# Patient Record
Sex: Female | Born: 1935 | Race: White | Hispanic: No | Smoking: Never smoker
Health system: Southern US, Community
[De-identification: ages and names within clinical notes are randomized; demographics above are authoritative.]

## PROBLEM LIST (undated history)

## (undated) DIAGNOSIS — K219 Gastro-esophageal reflux disease without esophagitis: Secondary | ICD-10-CM

## (undated) DIAGNOSIS — I639 Cerebral infarction, unspecified: Secondary | ICD-10-CM

## (undated) DIAGNOSIS — H409 Unspecified glaucoma: Secondary | ICD-10-CM

## (undated) DIAGNOSIS — I071 Rheumatic tricuspid insufficiency: Secondary | ICD-10-CM

## (undated) DIAGNOSIS — F419 Anxiety disorder, unspecified: Secondary | ICD-10-CM

## (undated) DIAGNOSIS — S72002A Fracture of unspecified part of neck of left femur, initial encounter for closed fracture: Secondary | ICD-10-CM

## (undated) DIAGNOSIS — I10 Essential (primary) hypertension: Secondary | ICD-10-CM

## (undated) DIAGNOSIS — E785 Hyperlipidemia, unspecified: Secondary | ICD-10-CM

## (undated) DIAGNOSIS — K635 Polyp of colon: Secondary | ICD-10-CM

## (undated) DIAGNOSIS — C55 Malignant neoplasm of uterus, part unspecified: Secondary | ICD-10-CM

## (undated) DIAGNOSIS — M109 Gout, unspecified: Secondary | ICD-10-CM

## (undated) DIAGNOSIS — J8410 Pulmonary fibrosis, unspecified: Secondary | ICD-10-CM

## (undated) DIAGNOSIS — M810 Age-related osteoporosis without current pathological fracture: Secondary | ICD-10-CM

## (undated) HISTORY — DX: Cerebral infarction, unspecified: I63.9

## (undated) HISTORY — PX: ABDOMINAL HYSTERECTOMY: SHX81

## (undated) HISTORY — PX: TONSILLECTOMY: SUR1361

## (undated) HISTORY — DX: Hyperlipidemia, unspecified: E78.5

## (undated) HISTORY — DX: Gastro-esophageal reflux disease without esophagitis: K21.9

## (undated) HISTORY — DX: Gout, unspecified: M10.9

## (undated) HISTORY — PX: BILATERAL OOPHORECTOMY: SHX1221

## (undated) HISTORY — PX: CATARACT EXTRACTION: SUR2

## (undated) HISTORY — PX: ORIF ACETABULAR FRACTURE: SHX5029

## (undated) HISTORY — DX: Unspecified glaucoma: H40.9

## (undated) HISTORY — DX: Age-related osteoporosis without current pathological fracture: M81.0

## (undated) HISTORY — DX: Polyp of colon: K63.5

## (undated) HISTORY — DX: Malignant neoplasm of uterus, part unspecified: C55

## (undated) HISTORY — DX: Anxiety disorder, unspecified: F41.9

## (undated) HISTORY — PX: FOOT SURGERY: SHX648

## (undated) HISTORY — DX: Essential (primary) hypertension: I10

## (undated) HISTORY — DX: Fracture of unspecified part of neck of left femur, initial encounter for closed fracture: S72.002A

## (undated) HISTORY — DX: Rheumatic tricuspid insufficiency: I07.1

## (undated) HISTORY — DX: Pulmonary fibrosis, unspecified: J84.10

---

## 1974-07-20 DIAGNOSIS — C55 Malignant neoplasm of uterus, part unspecified: Secondary | ICD-10-CM

## 1974-07-20 HISTORY — DX: Malignant neoplasm of uterus, part unspecified: C55

## 1999-09-04 ENCOUNTER — Ambulatory Visit (HOSPITAL_COMMUNITY): Admission: RE | Admit: 1999-09-04 | Discharge: 1999-09-04 | Payer: Self-pay | Admitting: Gastroenterology

## 1999-09-04 ENCOUNTER — Encounter (INDEPENDENT_AMBULATORY_CARE_PROVIDER_SITE_OTHER): Payer: Self-pay | Admitting: Specialist

## 2002-07-19 ENCOUNTER — Ambulatory Visit (HOSPITAL_COMMUNITY): Admission: RE | Admit: 2002-07-19 | Discharge: 2002-07-19 | Payer: Self-pay | Admitting: Family Medicine

## 2002-07-19 ENCOUNTER — Encounter: Payer: Self-pay | Admitting: Cardiovascular Disease

## 2002-11-23 ENCOUNTER — Ambulatory Visit (HOSPITAL_COMMUNITY): Admission: RE | Admit: 2002-11-23 | Discharge: 2002-11-23 | Payer: Self-pay | Admitting: Gastroenterology

## 2002-11-23 ENCOUNTER — Encounter (INDEPENDENT_AMBULATORY_CARE_PROVIDER_SITE_OTHER): Payer: Self-pay | Admitting: Specialist

## 2008-07-20 DIAGNOSIS — S72002A Fracture of unspecified part of neck of left femur, initial encounter for closed fracture: Secondary | ICD-10-CM

## 2008-07-20 HISTORY — DX: Fracture of unspecified part of neck of left femur, initial encounter for closed fracture: S72.002A

## 2009-05-05 ENCOUNTER — Inpatient Hospital Stay (HOSPITAL_COMMUNITY): Admission: EM | Admit: 2009-05-05 | Discharge: 2009-05-08 | Payer: Self-pay | Admitting: Emergency Medicine

## 2009-05-09 ENCOUNTER — Inpatient Hospital Stay (HOSPITAL_COMMUNITY): Admission: AD | Admit: 2009-05-09 | Discharge: 2009-05-14 | Payer: Self-pay | Admitting: Orthopedic Surgery

## 2009-05-10 ENCOUNTER — Encounter (INDEPENDENT_AMBULATORY_CARE_PROVIDER_SITE_OTHER): Payer: Self-pay | Admitting: Orthopedic Surgery

## 2009-05-10 ENCOUNTER — Ambulatory Visit: Payer: Self-pay | Admitting: Vascular Surgery

## 2009-07-23 ENCOUNTER — Encounter: Admission: RE | Admit: 2009-07-23 | Discharge: 2009-10-24 | Payer: Self-pay | Admitting: Orthopedic Surgery

## 2010-07-20 DIAGNOSIS — I639 Cerebral infarction, unspecified: Secondary | ICD-10-CM

## 2010-07-20 HISTORY — DX: Cerebral infarction, unspecified: I63.9

## 2010-10-23 LAB — BASIC METABOLIC PANEL
BUN: 14 mg/dL (ref 6–23)
CO2: 28 mEq/L (ref 19–32)
CO2: 30 mEq/L (ref 19–32)
Calcium: 8.1 mg/dL — ABNORMAL LOW (ref 8.4–10.5)
Calcium: 8.2 mg/dL — ABNORMAL LOW (ref 8.4–10.5)
Calcium: 8.8 mg/dL (ref 8.4–10.5)
Chloride: 102 mEq/L (ref 96–112)
Chloride: 103 mEq/L (ref 96–112)
Creatinine, Ser: 0.63 mg/dL (ref 0.4–1.2)
Creatinine, Ser: 0.64 mg/dL (ref 0.4–1.2)
GFR calc Af Amer: >60 mL/min (ref >60)
GFR calc non Af Amer: >60 mL/min (ref >60)
GFR calc non Af Amer: >60 mL/min (ref >60)
Glucose, Bld: 148 mg/dL — ABNORMAL HIGH (ref 70–99)
Glucose, Bld: 281 mg/dL — ABNORMAL HIGH (ref 70–99)
Potassium: 3.8 mEq/L (ref 3.5–5.1)
Potassium: 4 mEq/L (ref 3.5–5.1)

## 2010-10-23 LAB — CBC
HCT: 25.7 % — ABNORMAL LOW (ref 36.0–46.0)
HCT: 29 % — ABNORMAL LOW (ref 36.0–46.0)
HCT: 30.8 % — ABNORMAL LOW (ref 36.0–46.0)
HCT: 31 % — ABNORMAL LOW (ref 36.0–46.0)
HCT: 33.1 % — ABNORMAL LOW (ref 36.0–46.0)
HCT: 35.1 % — ABNORMAL LOW (ref 36.0–46.0)
Hemoglobin: 10.5 g/dL — ABNORMAL LOW (ref 12.0–15.0)
Hemoglobin: 10.6 g/dL — ABNORMAL LOW (ref 12.0–15.0)
Hemoglobin: 10.7 g/dL — ABNORMAL LOW (ref 12.0–15.0)
Hemoglobin: 11.4 g/dL — ABNORMAL LOW (ref 12.0–15.0)
Hemoglobin: 12 g/dL (ref 12.0–15.0)
Hemoglobin: 9.9 g/dL — ABNORMAL LOW (ref 12.0–15.0)
MCHC: 34.1 g/dL (ref 30.0–36.0)
MCHC: 34.3 g/dL (ref 30.0–36.0)
MCHC: 34.4 g/dL (ref 30.0–36.0)
MCHC: 34.6 g/dL (ref 30.0–36.0)
MCV: 90.3 fL (ref 78.0–100.0)
MCV: 90.6 fL (ref 78.0–100.0)
MCV: 91 fL (ref 78.0–100.0)
MCV: 91 fL (ref 78.0–100.0)
MCV: 91.8 fL (ref 78.0–100.0)
Platelets: 136 10*3/uL — ABNORMAL LOW (ref 150–400)
Platelets: 176 10*3/uL (ref 150–400)
Platelets: 249 10*3/uL (ref 150–400)
RBC: 3.35 MIL/uL — ABNORMAL LOW (ref 3.87–5.11)
RBC: 3.38 MIL/uL — ABNORMAL LOW (ref 3.87–5.11)
RBC: 3.45 MIL/uL — ABNORMAL LOW (ref 3.87–5.11)
RBC: 3.46 MIL/uL — ABNORMAL LOW (ref 3.87–5.11)
RBC: 3.67 MIL/uL — ABNORMAL LOW (ref 3.87–5.11)
RBC: 3.86 MIL/uL — ABNORMAL LOW (ref 3.87–5.11)
RBC: 4.22 MIL/uL (ref 3.87–5.11)
RDW: 13.3 % (ref 11.5–15.5)
RDW: 13.9 % (ref 11.5–15.5)
RDW: 14 % (ref 11.5–15.5)
RDW: 14.3 % (ref 11.5–15.5)
WBC: 13.7 10*3/uL — ABNORMAL HIGH (ref 4.0–10.5)
WBC: 8.1 10*3/uL (ref 4.0–10.5)
WBC: 8.4 10*3/uL (ref 4.0–10.5)
WBC: 8.4 10*3/uL (ref 4.0–10.5)
WBC: 8.8 10*3/uL (ref 4.0–10.5)

## 2010-10-23 LAB — DIFFERENTIAL
Basophils Absolute: 0 10*3/uL (ref 0.0–0.1)
Basophils Relative: 0 % (ref 0–1)
Eosinophils Relative: 0 % (ref 0–5)
Eosinophils Relative: 2 % (ref 0–5)
Lymphocytes Relative: 8 % — ABNORMAL LOW (ref 12–46)
Lymphs Abs: 1.2 10*3/uL (ref 0.7–4.0)
Monocytes Absolute: 0.7 10*3/uL (ref 0.1–1.0)
Monocytes Absolute: 0.8 10*3/uL (ref 0.1–1.0)
Monocytes Relative: 5 % (ref 3–12)
Neutrophils Relative %: 75 % (ref 43–77)
Neutrophils Relative %: 86 % — ABNORMAL HIGH (ref 43–77)

## 2010-10-23 LAB — URINALYSIS, ROUTINE W REFLEX MICROSCOPIC
Bilirubin Urine: NEGATIVE
Hgb urine dipstick: NEGATIVE
Nitrite: NEGATIVE
Specific Gravity, Urine: 1.018 (ref 1.005–1.030)
pH: 7.5 (ref 5.0–8.0)

## 2010-10-23 LAB — PROTIME-INR
INR: 1.28 (ref 0.00–1.49)
INR: 1.46 (ref 0.00–1.49)
INR: 1.76 — ABNORMAL HIGH (ref 0.00–1.49)
INR: 1.83 — ABNORMAL HIGH (ref 0.00–1.49)
Prothrombin Time: 13.5 seconds (ref 11.6–15.2)
Prothrombin Time: 18.7 seconds — ABNORMAL HIGH (ref 11.6–15.2)
Prothrombin Time: 21 seconds — ABNORMAL HIGH (ref 11.6–15.2)

## 2010-10-23 LAB — POCT I-STAT, CHEM 8
Calcium, Ion: 1.16 mmol/L (ref 1.12–1.32)
Creatinine, Ser: 0.8 mg/dL (ref 0.4–1.2)
HCT: 39 % (ref 36.0–46.0)
Sodium: 139 mEq/L (ref 135–145)

## 2010-10-23 LAB — TYPE AND SCREEN

## 2010-10-23 LAB — HEMOGLOBIN AND HEMATOCRIT, BLOOD: HCT: 33.3 % — ABNORMAL LOW (ref 36.0–46.0)

## 2010-12-05 NOTE — Op Note (Signed)
NAME:  Mary Church, Mary Church                       ACCOUNT NO.:  [id number]   MEDICAL RECORD NO.:  [id number]                   PATIENT TYPE:  AMB   LOCATION:  ENDO                                 FACILITY:  [facility]   PHYSICIAN:  Petra Kuba, M.D.                 DATE OF BIRTH:  Dec 30, 1922   DATE OF PROCEDURE:  11/23/2002  DATE OF DISCHARGE:                                 OPERATIVE REPORT   PROCEDURE:  Colonoscopy with polypectomy.   INDICATIONS FOR PROCEDURE:  A patient with a history of colon polyps due for  repeat screening.   Consent was signed after risks, benefits, methods, and options were  thoroughly discussed in the past.   MEDICINES USED:  Demerol 60, Versed 7.   DESCRIPTION OF PROCEDURE:  Rectal inspection was pertinent for external  hemorrhoids, small. Digital exam was negative. The pediatric video  adjustable colonoscope was inserted and with some difficulty due to a  tortuous looping sigmoid which required abdominal pressure and rolling her  on her back to advance. Then we were easily able to advance to the cecum. On  insertion, no abnormalities were seen. The cecum was identified by the  appendiceal orifice and the ileocecal valve. The prep on the left side was  greater on the right. We did do a moderate amount of washing and suctioning  for adequate visualization on the right. In the cecal pole, a tiny polyp was  seen and was cold biopsied x2. The scope was further withdrawn. In the mid  ascending, a tiny polyp was seen and was hot biopsied x1 and put in the same  container. No other polypoid lesions were seen as we slowly withdrew around  the transverse to the mid descending where a small polyp was seen and was  hot biopsied x1 and put in the second container. The scope was slowly  withdrawn through tortuous sigmoid through the rectum. Lesions could have  been missed in the sigmoid. We did try to readvance when we fell back around  the curves but that was  difficult due to tortuosity and looping. Once back  in the rectum, the scope was retroflexed pertinent for some internal  hemorrhoids. The scope was straightened and advanced a very short ways up  the left side of the colon, air was suctioned, scope removed. The patient  tolerated the procedure well. There was no obvious or immediate  complications.   ENDOSCOPIC DIAGNOSIS:  1. Internal and external hemorrhoids.  2. Tortuous sigmoid.  3. Three polyps small descending hot biopsied, tiny ascending hot biopsied,     tiny cecal cold biopsied.  4. Otherwise within normal limits to the cecum.    PLAN:  Await pathology but consider her age and other medical conditions  whether she be a repeat screening candidate. Happy to see back p.r.n.,  otherwise, return care to Dr. Dimple Casey for the customary health care maintenance  to  include yearly rectals and guaiacs.                                               Petra Kuba, M.D.    MEM/MEDQ  D:  11/23/2002  T:  11/24/2002  Job:  [id number]   cc:   Magnus Sinning. Dimple Casey, M.D.  [address] [place]  [place] [address]  Fax: [phone]

## 2011-02-17 ENCOUNTER — Emergency Department (HOSPITAL_COMMUNITY): Payer: Medicare Other

## 2011-02-17 ENCOUNTER — Encounter: Payer: Self-pay | Admitting: Ophthalmology

## 2011-02-17 ENCOUNTER — Inpatient Hospital Stay (HOSPITAL_COMMUNITY)
Admission: EM | Admit: 2011-02-17 | Discharge: 2011-02-23 | DRG: 065 | Disposition: A | Discharge status: Skilled Nursing Facility | Payer: Medicare Other | Attending: Internal Medicine | Admitting: Internal Medicine

## 2011-02-17 DIAGNOSIS — E785 Hyperlipidemia, unspecified: Secondary | ICD-10-CM | POA: Diagnosis present

## 2011-02-17 DIAGNOSIS — I6350 Cerebral infarction due to unspecified occlusion or stenosis of unspecified cerebral artery: Principal | ICD-10-CM | POA: Diagnosis present

## 2011-02-17 DIAGNOSIS — I10 Essential (primary) hypertension: Secondary | ICD-10-CM | POA: Diagnosis present

## 2011-02-17 DIAGNOSIS — E119 Type 2 diabetes mellitus without complications: Secondary | ICD-10-CM | POA: Diagnosis present

## 2011-02-17 DIAGNOSIS — R0902 Hypoxemia: Secondary | ICD-10-CM | POA: Diagnosis present

## 2011-02-17 DIAGNOSIS — Z7982 Long term (current) use of aspirin: Secondary | ICD-10-CM

## 2011-02-17 DIAGNOSIS — M109 Gout, unspecified: Secondary | ICD-10-CM | POA: Diagnosis present

## 2011-02-17 DIAGNOSIS — J9819 Other pulmonary collapse: Secondary | ICD-10-CM | POA: Diagnosis not present

## 2011-02-17 LAB — DIFFERENTIAL
Eosinophils Absolute: 0.4 10*3/uL (ref 0.0–0.7)
Eosinophils Relative: 5 % (ref 0–5)
Lymphocytes Relative: 16 % (ref 12–46)
Lymphs Abs: 1.3 10*3/uL (ref 0.7–4.0)
Monocytes Relative: 9 % (ref 3–12)

## 2011-02-17 LAB — CBC
HCT: 36 % (ref 36.0–46.0)
MCH: 30.1 pg (ref 26.0–34.0)
MCV: 88 fL (ref 78.0–100.0)
Platelets: 213 10*3/uL (ref 150–400)
RDW: 12.8 % (ref 11.5–15.5)
WBC: 7.9 10*3/uL (ref 4.0–10.5)

## 2011-02-17 LAB — URINALYSIS, ROUTINE W REFLEX MICROSCOPIC
Bilirubin Urine: NEGATIVE
Ketones, ur: NEGATIVE mg/dL
Specific Gravity, Urine: 1.014 (ref 1.005–1.030)
pH: 6 (ref 5.0–8.0)

## 2011-02-17 LAB — BASIC METABOLIC PANEL
Calcium: 9.2 mg/dL (ref 8.4–10.5)
GFR calc Af Amer: >60 mL/min (ref >60)
GFR calc non Af Amer: >60 mL/min (ref >60)
Potassium: 3.6 mEq/L (ref 3.5–5.1)
Sodium: 142 mEq/L (ref 135–145)

## 2011-02-17 LAB — URINE MICROSCOPIC-ADD ON

## 2011-02-17 LAB — TROPONIN I: Troponin I: <0.3 ng/mL (ref <0.30)

## 2011-02-17 NOTE — H&P (Signed)
Hospital Admission Note Date: 02/18/2011  Patient name: Mary Church Medical record number: [id number] Date of birth: 19-Apr-1923 Age: 77 y.o. Gender: female PCP: Bennie Pierini, FNP, FNP  Medical Service: Internal Medicine Teaching Service  Attending physician:  Dr. Coralee Pesa Resident [phone]): Dr. Scot Dock   Pager: [phone] Resident (R1): Dr. Manson Passey    Pager: [phone]  Chief Complaint: Weakness, dizziness, difficulty with movements  History of Present Illness: Patient is a 77 year old woman with a history of HTN and HLD who presents with an episode of weakness and dizziness that began at about 5pm this evening. She said she sat down to dinner with her friend and she acutely began to feel dizzy as well as unable to 'move her arms like she wanted to'. She also was nervous to stand up as she felt that she would not be able to hold herself steady. She denies vision changes, LOC, difficulty chewing her food, vertigo, nausea, vomiting and diarrhea. She also reports recent weight loss and frequent urination. She has no history of prior stroke.  Meds: Crestor Unknown antihypertensive medications  Allergies: Review of patient's allergies indicates not on file. Past Medical History  Diagnosis Date  . HTN (hypertension)   . HLD (hyperlipidemia)   . History of ovarian cancer 1976  . Pulmonary fibrosis     seen on X-ray   . Hip fracture, left 2010   Past Surgical History  Procedure Date  . Bilateral oophorectomy   . Orif acetabular fracture    Family History  Problem Relation Age of Onset  . Lung cancer Brother   . Diabetes Maternal Aunt    History   Social History  . Marital Status: Widowed    Spouse Name: N/A    Number of Children: N/A  . Years of Education: N/A   Occupational History  . Not on file.   Social History Main Topics  . Smoking status: Never Smoker   . Smokeless tobacco: Not on file  . Alcohol Use: Not on file  . Drug Use: Not on file  . Sexually Active:  Not on file   Social History Narrative   Patient reports she currently lives with a friend who has early stage Alzheimer's disease. She has 2 sisters and a brother who live in the [place] area.   Review of Systems: ROS is otherwise negative except as noted in HPI.  Physical Exam: Vitals: T: 98.1  HR: 72  BP: 199/65  RR: 18  O2 saturation: 92% RA General: elderly woman resting in bed, in no acute distress HEENT: PERRL, EOMI, no scleral icterus, cheeks appear somewhat erythematous Cardiac: RRR, no rubs, murmurs or gallops Pulm: clear to auscultation bilaterally, moving normal volumes of air Abd: soft, nontender, nondistended, BS present Ext: warm and well perfused, no pedal edema Neuro: alert and oriented X3, cranial nerves II-XII grossly intact, strength and sensation to light touch equal in bilateral face and upper and lower extremities.  Lab results: Admission on 02/17/2011  Component Date Value Range Status  . Neutrophils Relative (%) 02/17/2011 70  43-77 Final  . Neutro Abs (K/uL) 02/17/2011 5.6  1.7-7.7 Final  . Lymphocytes Relative (%) 02/17/2011 16  12-46 Final  . Lymphs Abs (K/uL) 02/17/2011 1.3  0.7-4.0 Final  . Monocytes Relative (%) 02/17/2011 9  3-12 Final  . Monocytes Absolute (K/uL) 02/17/2011 0.7  0.1-1.0 Final  . Eosinophils Relative (%) 02/17/2011 5  0-5 Final  . Eosinophils Absolute (K/uL) 02/17/2011 0.4  0.0-0.7 Final  . Basophils  Relative (%) 02/17/2011 0  0-1 Final  . Basophils Absolute (K/uL) 02/17/2011 0.0  0.0-0.1 Final  . WBC (K/uL) 02/17/2011 7.9  4.0-10.5 Final  . RBC (MIL/uL) 02/17/2011 4.09  3.87-5.11 Final  . Hemoglobin (g/dL) 07/22/7251 66.4  40.3-47.4 Final  . HCT (%) 02/17/2011 36.0  36.0-46.0 Final  . MCV (fL) 02/17/2011 88.0  78.0-100.0 Final  . MCH (pg) 02/17/2011 30.1  26.0-34.0 Final  . MCHC (g/dL) 25/95/6387 56.4  33.2-95.1 Final  . RDW (%) 02/17/2011 12.8  11.5-15.5 Final  . Platelets (K/uL) 02/17/2011 213  150-400 Final  . Color,  Urine  02/17/2011 YELLOW  YELLOW Final  . Appearance  02/17/2011 CLEAR  CLEAR Final  . Specific Gravity, Urine  02/17/2011 1.014  1.005-1.030 Final  . pH  02/17/2011 6.0  5.0-8.0 Final  . Glucose, UA (mg/dL) 88/41/6606 NEGATIVE  NEGATIVE Final  . Hgb urine dipstick  02/17/2011 TRACE* NEGATIVE Final  . Bilirubin Urine  02/17/2011 NEGATIVE  NEGATIVE Final  . Ketones, ur (mg/dL) 30/16/0109 NEGATIVE  NEGATIVE Final  . Protein, ur (mg/dL) 32/35/5732 NEGATIVE  NEGATIVE Final  . Urobilinogen, UA (mg/dL) 20/25/4270 0.2  6.2-3.7 Final  . Nitrite  02/17/2011 NEGATIVE  NEGATIVE Final  . Leukocytes, UA  02/17/2011 TRACE* NEGATIVE Final  . Squamous Epithelial / LPF  02/17/2011 RARE  RARE Final  . WBC, UA (WBC/hpf) 02/17/2011 0-2  <3 Final  . RBC / HPF (RBC/hpf) 02/17/2011 0-2  <3 Final  . Bacteria, UA  02/17/2011 RARE  RARE Final  . Daryll Drown  02/17/2011 MUCOUS PRESENT   Final  . Sodium (mEq/L) 02/17/2011 142  135-145 Final  . Potassium (mEq/L) 02/17/2011 3.6  3.5-5.1 Final  . Chloride (mEq/L) 02/17/2011 102  96-112 Final  . CO2 (mEq/L) 02/17/2011 29  19-32 Final  . Glucose, Bld (mg/dL) 62/83/1517 616* 07-37 Final  . BUN (mg/dL) 10/62/6948 21  5-46 Final  . Creatinine, Ser (mg/dL) 27/09/5007 3.81* 8.29-9.37 Final  . Calcium (mg/dL) 16/96/7893 9.2  8.1-01.7 Final  . GFR calc non Af Amer (mL/min) 02/17/2011 >60  >60 Final  . GFR calc Af Amer (mL/min) 02/17/2011 >60  >60 Final  . Troponin I (ng/mL) 02/17/2011 <0.30  <0.30 Final   Imaging results:  CT HEAD WITHOUT CONTRAST IMPRESSION:  Chronic diffuse atrophic and small vessel ischemic changes. Suggestion of decreased density in the left insular cortex which might represent early changes of acute infarct. No evidence of acute intracranial hemorrhage or mass lesion.  CHEST - 2 VIEW IMPRESSION:  Mild pulmonary fibrosis which appears to be developing since the prior study. Chronic bronchitic changes. No active consolidation  Other results: EKG:  sinus rhythm with 1st degree AV block, not significantly changed from prior tracings  Assessment & Plan by Problem: 1) Dizziness and weakness: Acute onset. No history of stroke. No significant lab abnormalities. CT head consistent with early left insular stroke- although not sure about presentation findings due to stroke. Risk factors of hypertension and hyperlipidemia.  - Admit to telemetry bed. - Aspirin 325 mg by mouth daily. - 2-D echo and carotid Dopplers. - HbA1c and fasting lipid panel for risk stratification. - PT OT and speech therapy consult. - Blood pressure management as below.  2) Hypertension: Systolic blood pressure severely elevated in ED. - Will hold blood pressure medication for 24 hours - Will give labetalol if systolic blood pressure goes more than 200.  3) Hyperlipidemia: FLP . - Continue Crestor.  4) VTE prophylaxis: Lovenox.   R2/3______________________________  R1________________________________  ATTENDING: I performed and/or observed a history and physical examination of the patient.  I discussed the case with the residents as noted and reviewed the residents' notes.  I agree with the findings and plan--please refer to the attending physician note for more details.  Signature________________________________  Printed Name_____________________________

## 2011-02-18 ENCOUNTER — Encounter: Payer: Self-pay | Admitting: Ophthalmology

## 2011-02-18 ENCOUNTER — Inpatient Hospital Stay (HOSPITAL_COMMUNITY): Payer: Medicare Other

## 2011-02-18 DIAGNOSIS — I359 Nonrheumatic aortic valve disorder, unspecified: Secondary | ICD-10-CM

## 2011-02-18 DIAGNOSIS — I6350 Cerebral infarction due to unspecified occlusion or stenosis of unspecified cerebral artery: Secondary | ICD-10-CM

## 2011-02-18 LAB — COMPREHENSIVE METABOLIC PANEL
BUN: 14 mg/dL (ref 6–23)
CO2: 26 mEq/L (ref 19–32)
Calcium: 9 mg/dL (ref 8.4–10.5)
Creatinine, Ser: 0.48 mg/dL — ABNORMAL LOW (ref 0.50–1.10)
GFR calc Af Amer: >60 mL/min (ref >60)
GFR calc non Af Amer: >60 mL/min (ref >60)
Glucose, Bld: 279 mg/dL — ABNORMAL HIGH (ref 70–99)

## 2011-02-18 LAB — CBC
HCT: 35 % — ABNORMAL LOW (ref 36.0–46.0)
Hemoglobin: 12 g/dL (ref 12.0–15.0)
MCH: 30 pg (ref 26.0–34.0)
MCHC: 34.3 g/dL (ref 30.0–36.0)
RDW: 12.7 % (ref 11.5–15.5)

## 2011-02-18 LAB — HEPATIC FUNCTION PANEL
AST: 17 U/L (ref 0–37)
Bilirubin, Direct: 0.1 mg/dL (ref 0.0–0.3)
Indirect Bilirubin: 0.2 mg/dL — ABNORMAL LOW (ref 0.3–0.9)
Total Bilirubin: 0.3 mg/dL (ref 0.3–1.2)

## 2011-02-18 LAB — CK TOTAL AND CKMB (NOT AT ARMC): CK, MB: 2.8 ng/mL (ref 0.3–4.0)

## 2011-02-18 LAB — LIPID PANEL
Cholesterol: 135 mg/dL (ref 0–200)
Total CHOL/HDL Ratio: 3.4 RATIO
Triglycerides: 200 mg/dL — ABNORMAL HIGH (ref <150)
VLDL: 40 mg/dL (ref 0–40)

## 2011-02-18 LAB — PROTIME-INR
INR: 1.05 (ref 0.00–1.49)
INR: 1.06 (ref 0.00–1.49)
Prothrombin Time: 13.9 seconds (ref 11.6–15.2)

## 2011-02-18 LAB — CARDIAC PANEL(CRET KIN+CKTOT+MB+TROPI): Total CK: 137 U/L (ref 7–177)

## 2011-02-18 LAB — GLUCOSE, CAPILLARY: Glucose-Capillary: 220 mg/dL — ABNORMAL HIGH (ref 70–99)

## 2011-02-19 DIAGNOSIS — I6350 Cerebral infarction due to unspecified occlusion or stenosis of unspecified cerebral artery: Secondary | ICD-10-CM

## 2011-02-19 DIAGNOSIS — I6330 Cerebral infarction due to thrombosis of unspecified cerebral artery: Secondary | ICD-10-CM

## 2011-02-19 LAB — GLUCOSE, CAPILLARY: Glucose-Capillary: 217 mg/dL — ABNORMAL HIGH (ref 70–99)

## 2011-02-19 LAB — BASIC METABOLIC PANEL
CO2: 30 mEq/L (ref 19–32)
Calcium: 9.2 mg/dL (ref 8.4–10.5)
Chloride: 101 mEq/L (ref 96–112)
Sodium: 141 mEq/L (ref 135–145)

## 2011-02-19 LAB — CBC
MCH: 30 pg (ref 26.0–34.0)
Platelets: 211 10*3/uL (ref 150–400)
RBC: 4.04 MIL/uL (ref 3.87–5.11)
WBC: 9.7 10*3/uL (ref 4.0–10.5)

## 2011-02-19 LAB — MAGNESIUM: Magnesium: 1.8 mg/dL (ref 1.5–2.5)

## 2011-02-20 LAB — GLUCOSE, CAPILLARY
Glucose-Capillary: 161 mg/dL — ABNORMAL HIGH (ref 70–99)
Glucose-Capillary: 190 mg/dL — ABNORMAL HIGH (ref 70–99)

## 2011-02-20 LAB — BASIC METABOLIC PANEL
CO2: 30 mEq/L (ref 19–32)
Calcium: 9.6 mg/dL (ref 8.4–10.5)
Chloride: 101 mEq/L (ref 96–112)
Glucose, Bld: 187 mg/dL — ABNORMAL HIGH (ref 70–99)
Potassium: 3.8 mEq/L (ref 3.5–5.1)
Sodium: 140 mEq/L (ref 135–145)

## 2011-02-20 LAB — CBC
Hemoglobin: 12.5 g/dL (ref 12.0–15.0)
MCH: 29.9 pg (ref 26.0–34.0)
RBC: 4.18 MIL/uL (ref 3.87–5.11)

## 2011-02-21 LAB — BASIC METABOLIC PANEL
BUN: 19 mg/dL (ref 6–23)
Chloride: 99 mEq/L (ref 96–112)
Potassium: 3.7 mEq/L (ref 3.5–5.1)
Sodium: 138 mEq/L (ref 135–145)

## 2011-02-21 LAB — GLUCOSE, CAPILLARY
Glucose-Capillary: 166 mg/dL — ABNORMAL HIGH (ref 70–99)
Glucose-Capillary: 184 mg/dL — ABNORMAL HIGH (ref 70–99)
Glucose-Capillary: 185 mg/dL — ABNORMAL HIGH (ref 70–99)

## 2011-02-21 LAB — CBC
HCT: 36.9 % (ref 36.0–46.0)
Hemoglobin: 12.7 g/dL (ref 12.0–15.0)
MCHC: 34.4 g/dL (ref 30.0–36.0)
RDW: 12.9 % (ref 11.5–15.5)
WBC: 8.5 10*3/uL (ref 4.0–10.5)

## 2011-02-22 LAB — BASIC METABOLIC PANEL
Calcium: 9.4 mg/dL (ref 8.4–10.5)
GFR calc Af Amer: >60 mL/min (ref >60)
GFR calc non Af Amer: >60 mL/min (ref >60)
Potassium: 4 mEq/L (ref 3.5–5.1)
Sodium: 139 mEq/L (ref 135–145)

## 2011-02-22 LAB — GLUCOSE, CAPILLARY
Glucose-Capillary: 168 mg/dL — ABNORMAL HIGH (ref 70–99)
Glucose-Capillary: 142 mg/dL — ABNORMAL HIGH (ref 70–99)

## 2011-02-22 LAB — CBC
MCH: 30.4 pg (ref 26.0–34.0)
MCHC: 34.5 g/dL (ref 30.0–36.0)
Platelets: 232 10*3/uL (ref 150–400)
RDW: 13 % (ref 11.5–15.5)

## 2011-02-22 LAB — MAGNESIUM: Magnesium: 1.8 mg/dL (ref 1.5–2.5)

## 2011-02-23 DIAGNOSIS — I6350 Cerebral infarction due to unspecified occlusion or stenosis of unspecified cerebral artery: Secondary | ICD-10-CM

## 2011-02-23 LAB — CBC
HCT: 36.4 % (ref 36.0–46.0)
MCH: 29.4 pg (ref 26.0–34.0)
MCHC: 33.5 g/dL (ref 30.0–36.0)
MCV: 87.7 fL (ref 78.0–100.0)
Platelets: 275 10*3/uL (ref 150–400)
RDW: 12.9 % (ref 11.5–15.5)
WBC: 7.7 10*3/uL (ref 4.0–10.5)

## 2011-02-23 LAB — BASIC METABOLIC PANEL
BUN: 21 mg/dL (ref 6–23)
Calcium: 9.5 mg/dL (ref 8.4–10.5)
Chloride: 101 mEq/L (ref 96–112)
Creatinine, Ser: 0.47 mg/dL — ABNORMAL LOW (ref 0.50–1.10)
GFR calc Af Amer: >60 mL/min (ref >60)

## 2011-03-01 NOTE — Discharge Summary (Signed)
Mary Church, Mary Church             ACCOUNT NO.:  [id number]  MEDICAL RECORD NO.:  [id number]  LOCATION:  3703                         FACILITY:  [facility]  PHYSICIAN:  Tilford Pillar, MD     DATE OF BIRTH:  07/27/1922  DATE OF ADMISSION:  02/17/2011 DATE OF DISCHARGE:  02/23/2011                        DISCHARGE SUMMARY - REFERRING   DISCHARGE DIAGNOSES: 1. Acute stroke. 2. Hypertension. 3. Diabetes. 4. Hyperlipidemia.  DISCHARGE MEDICATION LIST: 1. Hydrochlorothiazide 25 mg by mouth daily. 2. Lisinopril 40 mg by mouth daily. 3. Metformin 500 mg by mouth twice daily with meals. 4. Allopurinol 100 mg 1 tablet by mouth daily. 5. Aspirin 81 mg by mouth daily. 6. Crestor 10 mg 1 tablet by mouth daily. 7. Felodipine ER 10 mg by mouth daily. 8. Vitamin E oral 1 tablet by mouth daily.  Stop taking lisinopril/hydrochlorothiazide 20/25 mg.  DISPOSITION AND FOLLOWUP:  The patient was discharge from [facility] in stable condition with resolution of neurologic symptoms. The patient is to follow up with Mary-Estella Daphine Deutscher at  [facility], phone number [phone], for continued management of her diabetes and hypertension.  PROCEDURES PERFORMED:  Chest x-ray on February 17, 2011, mild pulmonary fibrosis, chronic bronchitic changes, no active consolidation.  CT of the head on February 17, 2011, chronic diffuse atrophic and small vessel ischemic changes suggestion of decreased intensity in the left insular cortex which might represent early changes of acute infarct.  No evidence of acute intracranial hemorrhage or mass or lesion.  MRI of the head on February 18, 2011, acute/subacute nonhemorrhagic infarct involving the posterior and left corpus callosum.  Atrophy and moderate white matter.  EKG on February 18, 2011, shows sinus rhythm with first-degree atrioventricular block not significantly change from prior tracings.  CONSULTATIONS:  None.  ADMITTING HISTORY AND  PHYSICAL:  The patient is an 77 year old woman with a history of hypertension and hyperlipidemia, who presents for an episode of weakness and dizziness that began at about 5 p.m. this evening.  She sat down to dinner with a friend and she acutely began to feel dizzy as well as unable to "move her arms like she wanted to."  She also felt that she was not able to hold for herself steady.  She denies vision changes, loss of consciousness, difficulty chewing her food, vertigo, nausea, vomiting, and diarrhea.  She also reports recent weight loss and frequent urination.  She has no history of prior stroke.  PHYSICAL EXAMINATION:  VITAL SIGNS:  Temperature 98.1, blood pressure 199/65, heart rate 72, respiratory rate 18, and oxygen saturation 92% on room air. GENERAL:  An elderly woman resting in bed, in no acute distress. HEENT:  Pupils equal, round, and reactive to light.  Extraocular movements intact.  No scleral icterus.  Cheeks appears somewhat erythematous. CARDIAC:  Regular rate and rhythm.  No rubs, murmurs, or gallops. PULMONARY:  Clear to auscultation bilaterally, moving normal volumes of air. ABDOMEN:  Soft, nontender, nondistended.  Bowel sounds present. EXTREMITIES:  Warm and well perfused, no pedal edema. NEUROLOGIC:  Alert and oriented x3.  Cranial nerves II-XII are grossly intact.  Strength and sensation to light equal in bilateral face and upper and lower extremities.  ADMITTING LABS:  Sodium 132, potassium 3.2, chloride 102, bicarb 29, BUN 21, creatinine 0.48, glucose 179.  WBC 7.9, hemoglobin 12.3, hematocrit 36, platelets 213.  Urinalysis is negative for nitrites, trace leukocytes, rare squamous epithelial, 0-2 white blood cell count, trace hemoglobin, 0-2 rbcs.  Troponin less than 0.3.  HOSPITAL COURSE BY PROBLEM: 1. Acute stroke.  An acute/subacute infarct involving the posterior     left corpus callosum which seen on CT and MRI on admission with     symptoms of  weakness and dizziness.  The patient was given aspirin 325 mg and admitted to telemetry.  Carotid Dopplers were normal.  Echocardiogram showed ejection fraction of 60-65% with mitral valve thickening.  Hemoglobin A1c was found to 8.5.  The patient's improved the next day and gradually improved back to baseline by hospital discharge.  Her stroke was thought to be contributed by her risk factors of diabetes, hypertension, and hyperlipidemia.  The patient was discharged in stable condition on aspirin. 2. Diabetes.  The patient was admitted with hemoglobin A1c of 8.5.     The patient notes 1 year history of increased capillary blood     glucose levels, but not official diagnosis of diabetes.  The     patient was started on metformin in hospital and discharged at a     dose of 500 mg p.o. b.i.d.  The patient's capillary blood glucose     levels in hospital were in the 100s-200s.  The patient will follow     up for her diabetes on an outpatient basis. 3. Hypertension.  The patient was admitted with blood pressure of     199/65.  She was allowed to have permissive hypertension for the     first 24 hours in the setting of acute stroke.  Her home blood     pressure medications were gradually added back including lisinopril     20, hydrochlorothiazide 25, and felodipine 10.  Her lisinopril was     increased to 40 mg.  Followup as an outpatient. 4. Hyperlipidemia.  The patient was on home medications of Crestor 10,     her LDL in hospital was found to be 55, she was discharged on her     home Crestor dose.  DISCHARGE LABS AND VITALS:  Temperature 97.7, blood pressure 160/68, pulse 60, respirations 18, oxygen saturation 96% on room air.  WBC 7.7, hemoglobin 12.2, hematocrit 36.4, platelets 275.  Capillary blood glucose 114.  Sodium 139, potassium 3.8, chloride 101, bicarb 28, BUN 21, creatinine 0.47, glucose 167, and calcium 9.5.    ______________________________ Janalyn Harder,  MD   ______________________________ Tilford Pillar, MD    RB/MEDQ  D:  02/23/2011  T:  02/23/2011  Job:  [id number]  cc:   Mary-Congetta Daphine Deutscher  Electronically Signed by Janalyn Harder MD on 03/01/2011 08:57:56 AM Electronically Signed by Tilford Pillar  on 03/01/2011 01:01:59 PM

## 2012-01-31 ENCOUNTER — Encounter (HOSPITAL_COMMUNITY): Payer: Self-pay

## 2012-01-31 ENCOUNTER — Emergency Department (HOSPITAL_COMMUNITY)
Admission: EM | Admit: 2012-01-31 | Discharge: 2012-01-31 | Disposition: A | Discharge status: Home / Self Care | Payer: BC Managed Care – PPO | Attending: Emergency Medicine | Admitting: Emergency Medicine

## 2012-01-31 DIAGNOSIS — W57XXXA Bitten or stung by nonvenomous insect and other nonvenomous arthropods, initial encounter: Secondary | ICD-10-CM

## 2012-01-31 DIAGNOSIS — E785 Hyperlipidemia, unspecified: Secondary | ICD-10-CM | POA: Insufficient documentation

## 2012-01-31 DIAGNOSIS — E119 Type 2 diabetes mellitus without complications: Secondary | ICD-10-CM | POA: Insufficient documentation

## 2012-01-31 DIAGNOSIS — S90569A Insect bite (nonvenomous), unspecified ankle, initial encounter: Secondary | ICD-10-CM | POA: Insufficient documentation

## 2012-01-31 DIAGNOSIS — I10 Essential (primary) hypertension: Secondary | ICD-10-CM | POA: Insufficient documentation

## 2012-01-31 MED ORDER — DOXYCYCLINE HYCLATE 100 MG PO TABS
100.0000 mg | ORAL_TABLET | Freq: Once | ORAL | Status: AC
  Administered 2012-01-31: 100 mg via ORAL
  Filled 2012-01-31: qty 1

## 2012-01-31 MED ORDER — DOXYCYCLINE HYCLATE 100 MG PO CAPS: 100.0000 mg | ORAL_CAPSULE | Freq: Two times a day (BID) | ORAL | Status: AC

## 2012-01-31 NOTE — ED Notes (Signed)
Arrived by ems for tick bite to left lower leg--behind knee

## 2012-01-31 NOTE — ED Provider Notes (Signed)
Medical screening examination/treatment/procedure(s) were performed by non-physician practitioner and as supervising physician I was immediately available for consultation/collaboration.   Delton Stelle L Teruko Joswick, MD 01/31/12 1511 

## 2012-01-31 NOTE — ED Notes (Signed)
Pt reports can't get a ride until 2pm.  Told pt she could stay in room until her ride came or until we needed the room.  Ordered pt a meal tray.

## 2012-01-31 NOTE — ED Notes (Signed)
Pt presents with small tick to back of left leg. Pt denies rash, fever and unexplained weakness.

## 2012-01-31 NOTE — ED Notes (Signed)
Meal tray given 

## 2012-01-31 NOTE — ED Provider Notes (Signed)
History     CSN: [id number]  Arrival date & time 01/31/12  1040   First MD Initiated Contact with Patient 01/31/12 1051      Chief Complaint  Patient presents with  . Tick Removal    (Consider location/radiation/quality/duration/timing/severity/associated sxs/prior treatment) HPI Comments: Pt noted a tiny tick imbedded in L leg last PM.  States it has itched all night long.  The history is provided by the patient. No language interpreter was used.    Past Medical History  Diagnosis Date  . HTN (hypertension)   . HLD (hyperlipidemia)   . History of ovarian cancer 1976  . Pulmonary fibrosis     seen on X-ray   . Hip fracture, left 2010  . Diabetes mellitus     Past Surgical History  Procedure Date  . Bilateral oophorectomy   . Orif acetabular fracture     Family History  Problem Relation Age of Onset  . Lung cancer Brother   . Diabetes Maternal Aunt     History  Substance Use Topics  . Smoking status: Never Smoker   . Smokeless tobacco: Not on file  . Alcohol Use: No    OB History    Grav Para Term Preterm Abortions TAB SAB Ect Mult Living                  Review of Systems  Constitutional: Negative for fever and chills.  Musculoskeletal: Negative for joint swelling and arthralgias.  Skin: Negative for rash.  All other systems reviewed and are negative.    Allergies  Review of patient's allergies indicates no known allergies.  Home Medications   Current Outpatient Rx  Name Route Sig Dispense Refill  . ALPRAZOLAM 0.5 MG PO TABS Oral Take 0.5 mg by mouth 3 (three) times daily.    Marland Kitchen VITAMIN D 1000 UNITS PO TABS Oral Take 1,000 Units by mouth daily.    . METHYLCELLULOSE 1 % OP SOLN Both Eyes Place 1 drop into both eyes daily.    Marland Kitchen DOXYCYCLINE HYCLATE 100 MG PO CAPS Oral Take 1 capsule (100 mg total) by mouth 2 (two) times daily. 20 capsule 0    BP 150/44  Pulse 51  Temp 97.7 F (36.5 C) (Oral)  Resp 20  Ht 5\' 4"  (1.626 m)  Wt 113 lb (51.256  kg)  BMI 19.40 kg/m2  SpO2 100%  Physical Exam  Nursing note and vitals reviewed. Constitutional: She is oriented to person, place, and time. She appears well-developed and well-nourished. No distress.  HENT:  Head: Normocephalic and atraumatic.  Eyes: EOM are normal.  Neck: Normal range of motion.  Cardiovascular: Normal rate, regular rhythm and normal heart sounds.   Pulmonary/Chest: Effort normal and breath sounds normal.  Abdominal: Soft. She exhibits no distension. There is no tenderness.  Musculoskeletal: Normal range of motion.       Legs: Neurological: She is alert and oriented to person, place, and time.  Skin: Skin is warm and dry.  Psychiatric: She has a normal mood and affect. Judgment normal.    ED Course  Procedures (including critical care time)  Labs Reviewed - No data to display No results found.   1. Tick bite       MDM  rx-doxycycline, 20 F/u with PCP        Evalina Field, PA 01/31/12 1210

## 2012-10-04 ENCOUNTER — Other Ambulatory Visit: Payer: Self-pay | Admitting: Nurse Practitioner

## 2012-10-04 ENCOUNTER — Other Ambulatory Visit: Payer: Self-pay | Admitting: *Deleted

## 2012-10-04 DIAGNOSIS — F411 Generalized anxiety disorder: Secondary | ICD-10-CM

## 2012-10-04 MED ORDER — ALPRAZOLAM 0.5 MG PO TABS: 0.5000 mg | ORAL_TABLET | Freq: Three times a day (TID) | ORAL | Status: DC

## 2012-10-06 ENCOUNTER — Telehealth: Payer: Self-pay | Admitting: Nurse Practitioner

## 2012-10-06 NOTE — Telephone Encounter (Signed)
"  why haven't you refilled her meds"?  Xanax  [facility]

## 2012-10-06 NOTE — Telephone Encounter (Signed)
Please advise 

## 2012-10-07 ENCOUNTER — Other Ambulatory Visit: Payer: Self-pay | Admitting: Nurse Practitioner

## 2012-10-07 DIAGNOSIS — F411 Generalized anxiety disorder: Secondary | ICD-10-CM

## 2012-10-07 MED ORDER — ALPRAZOLAM 0.5 MG PO TABS: 0.5000 mg | ORAL_TABLET | Freq: Three times a day (TID) | ORAL | Status: DC

## 2012-10-07 NOTE — Telephone Encounter (Signed)
Very upset  About xanax dont drive cant come to pick up please call

## 2012-10-17 ENCOUNTER — Other Ambulatory Visit: Payer: Self-pay | Admitting: *Deleted

## 2012-10-17 DIAGNOSIS — E119 Type 2 diabetes mellitus without complications: Secondary | ICD-10-CM

## 2012-10-17 MED ORDER — GLUCOSE BLOOD VI STRP: ORAL_STRIP | Status: DC

## 2012-11-23 ENCOUNTER — Other Ambulatory Visit: Payer: Self-pay

## 2012-11-23 DIAGNOSIS — F411 Generalized anxiety disorder: Secondary | ICD-10-CM

## 2012-11-23 MED ORDER — ALPRAZOLAM 0.5 MG PO TABS: 0.5000 mg | ORAL_TABLET | Freq: Three times a day (TID) | ORAL | Status: DC

## 2012-11-23 NOTE — Telephone Encounter (Signed)
Ok to call in xanax rx- NTBS

## 2012-11-23 NOTE — Telephone Encounter (Signed)
Last written 08/30/12  Last seen 1018/13  If approved call in and have nurse notify patient

## 2012-11-24 NOTE — Telephone Encounter (Signed)
Called into pharmacy; patient aware  

## 2012-11-30 NOTE — Telephone Encounter (Signed)
Xanax refilled and patient call to come pick up. Xanax must be printed and we can not send electronically.

## 2013-01-03 ENCOUNTER — Other Ambulatory Visit: Payer: Self-pay | Admitting: Nurse Practitioner

## 2013-01-05 ENCOUNTER — Other Ambulatory Visit: Payer: Self-pay

## 2013-01-05 DIAGNOSIS — F411 Generalized anxiety disorder: Secondary | ICD-10-CM

## 2013-01-05 MED ORDER — ALPRAZOLAM 0.5 MG PO TABS: 0.5000 mg | ORAL_TABLET | Freq: Three times a day (TID) | ORAL | Status: DC

## 2013-01-05 NOTE — Telephone Encounter (Signed)
Called in.

## 2013-01-05 NOTE — Telephone Encounter (Signed)
NTBS- Call in rx for xanax with 0 refills

## 2013-01-05 NOTE — Telephone Encounter (Signed)
Last seen 05/01/13  No upcoming visit scheduled      If approved call in and have nurse inform patient

## 2013-01-05 NOTE — Telephone Encounter (Signed)
Last seen 05/06/12  Last glucose level 04/06/12  No upcoming appt scheduled

## 2013-02-15 ENCOUNTER — Other Ambulatory Visit: Payer: Self-pay | Admitting: *Deleted

## 2013-02-15 DIAGNOSIS — F411 Generalized anxiety disorder: Secondary | ICD-10-CM

## 2013-02-15 MED ORDER — ALPRAZOLAM 0.5 MG PO TABS: 0.5000 mg | ORAL_TABLET | Freq: Three times a day (TID) | ORAL | Status: DC

## 2013-02-15 NOTE — Telephone Encounter (Signed)
Med called to pharm 

## 2013-02-15 NOTE — Telephone Encounter (Signed)
Please call in xanax rx 

## 2013-02-15 NOTE — Telephone Encounter (Signed)
Last filled 01/05/13, last seen 05/06/12, call into [facility] [place] [facility] if approved

## 2013-02-15 NOTE — Telephone Encounter (Signed)
Needs refill on Xanax

## 2013-03-02 ENCOUNTER — Telehealth: Payer: Self-pay | Admitting: Nurse Practitioner

## 2013-03-02 NOTE — Telephone Encounter (Signed)
Mary Church did you call her she said someone called about her medications

## 2013-03-03 ENCOUNTER — Other Ambulatory Visit: Payer: Self-pay | Admitting: Nurse Practitioner

## 2013-03-03 NOTE — Telephone Encounter (Signed)
No, i didn't

## 2013-03-06 NOTE — Telephone Encounter (Signed)
Not seen since EPIC   Last glucose 02/22/13 in hospital

## 2013-03-31 ENCOUNTER — Other Ambulatory Visit: Payer: Self-pay | Admitting: Nurse Practitioner

## 2013-03-31 NOTE — Telephone Encounter (Signed)
Not seen since 05/06/12, last filled 02/15/13. Needs asap per Stew, route to pool B, needs to be called in today

## 2013-03-31 NOTE — Telephone Encounter (Signed)
Please call in rx for xanax 

## 2013-04-03 ENCOUNTER — Telehealth: Payer: Self-pay | Admitting: Nurse Practitioner

## 2013-04-03 NOTE — Telephone Encounter (Signed)
Medication called in to [facility]. Caregiver aware.

## 2013-04-03 NOTE — Telephone Encounter (Signed)
Appt scheduled.  Caregiver aware. Medication refilled on 9/12.  I will call it into the pharmacy.

## 2013-04-11 ENCOUNTER — Encounter: Payer: Self-pay | Admitting: Family Medicine

## 2013-04-11 ENCOUNTER — Ambulatory Visit (INDEPENDENT_AMBULATORY_CARE_PROVIDER_SITE_OTHER): Payer: Medicare Other | Admitting: Family Medicine

## 2013-04-11 VITALS — BP 132/68 | HR 56 | Temp 97.7°F | Ht 64.0 in | Wt 110.6 lb

## 2013-04-11 DIAGNOSIS — E559 Vitamin D deficiency, unspecified: Secondary | ICD-10-CM

## 2013-04-11 DIAGNOSIS — E785 Hyperlipidemia, unspecified: Secondary | ICD-10-CM | POA: Insufficient documentation

## 2013-04-11 DIAGNOSIS — J8410 Pulmonary fibrosis, unspecified: Secondary | ICD-10-CM | POA: Insufficient documentation

## 2013-04-11 DIAGNOSIS — K219 Gastro-esophageal reflux disease without esophagitis: Secondary | ICD-10-CM

## 2013-04-11 DIAGNOSIS — H409 Unspecified glaucoma: Secondary | ICD-10-CM | POA: Insufficient documentation

## 2013-04-11 DIAGNOSIS — S72002A Fracture of unspecified part of neck of left femur, initial encounter for closed fracture: Secondary | ICD-10-CM | POA: Insufficient documentation

## 2013-04-11 DIAGNOSIS — F419 Anxiety disorder, unspecified: Secondary | ICD-10-CM | POA: Insufficient documentation

## 2013-04-11 DIAGNOSIS — M810 Age-related osteoporosis without current pathological fracture: Secondary | ICD-10-CM | POA: Insufficient documentation

## 2013-04-11 DIAGNOSIS — I079 Rheumatic tricuspid valve disease, unspecified: Secondary | ICD-10-CM

## 2013-04-11 DIAGNOSIS — M109 Gout, unspecified: Secondary | ICD-10-CM | POA: Insufficient documentation

## 2013-04-11 DIAGNOSIS — I639 Cerebral infarction, unspecified: Secondary | ICD-10-CM | POA: Insufficient documentation

## 2013-04-11 DIAGNOSIS — I10 Essential (primary) hypertension: Secondary | ICD-10-CM | POA: Insufficient documentation

## 2013-04-11 DIAGNOSIS — K635 Polyp of colon: Secondary | ICD-10-CM | POA: Insufficient documentation

## 2013-04-11 DIAGNOSIS — F411 Generalized anxiety disorder: Secondary | ICD-10-CM

## 2013-04-11 DIAGNOSIS — C55 Malignant neoplasm of uterus, part unspecified: Secondary | ICD-10-CM | POA: Insufficient documentation

## 2013-04-11 DIAGNOSIS — I071 Rheumatic tricuspid insufficiency: Secondary | ICD-10-CM | POA: Insufficient documentation

## 2013-04-11 DIAGNOSIS — E119 Type 2 diabetes mellitus without complications: Secondary | ICD-10-CM | POA: Insufficient documentation

## 2013-04-11 DIAGNOSIS — Z23 Encounter for immunization: Secondary | ICD-10-CM

## 2013-04-11 DIAGNOSIS — I6350 Cerebral infarction due to unspecified occlusion or stenosis of unspecified cerebral artery: Secondary | ICD-10-CM

## 2013-04-11 DIAGNOSIS — D126 Benign neoplasm of colon, unspecified: Secondary | ICD-10-CM

## 2013-04-11 LAB — POCT GLYCOSYLATED HEMOGLOBIN (HGB A1C): Hemoglobin A1C: 5.9

## 2013-04-11 MED ORDER — METFORMIN HCL 500 MG PO TABS: ORAL_TABLET | ORAL | Status: DC

## 2013-04-11 MED ORDER — ROSUVASTATIN CALCIUM 10 MG PO TABS: 10.0000 mg | ORAL_TABLET | Freq: Every day | ORAL | Status: DC

## 2013-04-11 MED ORDER — FELODIPINE ER 10 MG PO TB24: 10.0000 mg | ORAL_TABLET | Freq: Every day | ORAL | Status: DC

## 2013-04-11 MED ORDER — LISINOPRIL-HYDROCHLOROTHIAZIDE 20-25 MG PO TABS: 1.0000 | ORAL_TABLET | Freq: Every day | ORAL | Status: DC

## 2013-04-11 MED ORDER — ALLOPURINOL 100 MG PO TABS: 100.0000 mg | ORAL_TABLET | Freq: Every day | ORAL | Status: DC

## 2013-04-11 MED ORDER — ALPRAZOLAM 0.5 MG PO TABS: ORAL_TABLET | ORAL | Status: DC

## 2013-04-11 NOTE — Progress Notes (Signed)
Patient ID: Mary Church, female   DOB: [birth year], 79 y.o.   MRN: [id number] SUBJECTIVE: CC: Chief Complaint  Patient presents with  . Follow-up    swithching from mmm . states broke femur 4 yrs ago and having difficulty getting around   . Medication Refill    wants refills on med and states she wants xanax refilled states takes xanax 2--3 times a day     HPI:   here to establish Patient is here for follow up of Diabetes Mellitus/HTN/HLD/history of stroke: Symptoms evaluated: Denies Nocturia ,Denies Urinary Frequency , denies Blurred vision ,deniesDizziness,denies.Dysuria,denies paresthesias, denies extremity pain or ulcers.Marland Kitchendenies chest pain. has had an annual eye exam. do check the feet. Does check CBGs. Average CBG: Denies episodes of hypoglycemia. Does have an emergency hypoglycemic plan. admits toCompliance with medications. Denies Problems with medications.  Breakfast: spam, bread, cereal Lunch: potato salad, 14 bean soup Supper: tomato sandwich   Past Medical History  Diagnosis Date  . Pulmonary fibrosis     seen on X-ray   . Hip fracture, left 2010  . Anxiety   . Uterine cancer 1976  . Diabetes mellitus   . HLD (hyperlipidemia)   . HTN (hypertension)   . Osteoporosis   . GERD (gastroesophageal reflux disease)   . Gout   . Glaucoma   . Tricuspid regurgitation   . Colon polyps   . Stroke 2012   Past Surgical History  Procedure Laterality Date  . Bilateral oophorectomy    . Orif acetabular fracture    . Abdominal hysterectomy    . Foot surgery    . Tonsillectomy    . Cataract extraction     History   Social History  . Marital Status: Widowed    Spouse Name: N/A    Number of Children: N/A  . Years of Education: N/A   Occupational History  . Not on file.   Social History Main Topics  . Smoking status: Never Smoker   . Smokeless tobacco: Not on file  . Alcohol Use: No  . Drug Use: No  . Sexual Activity: No   Other Topics Concern  . Not  on file   Social History Narrative   Patient reports she currently lives with a friend who has early stage Alzheimer's disease. She has 2 sisters and a brother who live in the [place] area.   Family History  Problem Relation Age of Onset  . Lung cancer Brother   . Diabetes Maternal Aunt    Current Outpatient Prescriptions on File Prior to Visit  Medication Sig Dispense Refill  . ALPRAZolam (XANAX) 0.5 MG tablet TAKE  (1)  TABLET  THREE TIMES DAILY.  90 tablet  0  . metFORMIN (GLUCOPHAGE) 500 MG tablet TAKE 1 TABLET IN MORNING  30 tablet  0  . methylcellulose (ARTIFICIAL TEARS) 1 % ophthalmic solution Place 1 drop into both eyes daily.      . cholecalciferol (VITAMIN D) 1000 UNITS tablet Take 1,000 Units by mouth daily.      Marland Kitchen glucose blood (TRUETRACK TEST) test strip Use as instructed  100 each  12   No current facility-administered medications on file prior to visit.   Allergies  Allergen Reactions  . Ace Inhibitors Cough  . Bactrim [Sulfamethoxazole W-Trimethoprim] Nausea Only  . Lipitor [Atorvastatin]     myalgia    There is no immunization history on file for this patient. Prior to Admission medications   Medication Sig Start Date End Date Taking? Authorizing Provider  allopurinol (ZYLOPRIM) 100 MG tablet Take 100 mg by mouth daily.   Yes Historical Provider, MD  ALPRAZolam (XANAX) 0.5 MG tablet TAKE  (1)  TABLET  THREE TIMES DAILY. 03/31/13  Yes Mary-Olyvia Daphine Deutscher, FNP  felodipine (PLENDIL) 10 MG 24 hr tablet  04/05/13  Yes Historical Provider, MD  lisinopril-hydrochlorothiazide (PRINZIDE,ZESTORETIC) 20-25 MG per tablet  04/05/13  Yes Historical Provider, MD  metFORMIN (GLUCOPHAGE) 500 MG tablet TAKE 1 TABLET IN MORNING 03/03/13  Yes Mary-Kameshia Daphine Deutscher, FNP  methylcellulose (ARTIFICIAL TEARS) 1 % ophthalmic solution Place 1 drop into both eyes daily.   Yes Historical Provider, MD  Multiple Vitamin (MULTIVITAMIN) tablet Take 1 tablet by mouth daily.   Yes Historical  Provider, MD  cholecalciferol (VITAMIN D) 1000 UNITS tablet Take 1,000 Units by mouth daily.    Historical Provider, MD  glucose blood (TRUETRACK TEST) test strip Use as instructed 10/17/12   Mary-Shalaya Daphine Deutscher, FNP    ROS: As above in the HPI. All other systems are stable or negative.  OBJECTIVE: APPEARANCE:  Patient in no acute distress.The patient appeared well nourished and normally developed. Acyanotic. Waist: VITAL SIGNS:BP 132/68  Pulse 56  Temp(Src) 97.7 F (36.5 C) (Oral)  Ht 5\' 4"  (1.626 m)  Wt 110 lb 9.6 oz (50.168 kg)  BMI 18.98 kg/m2 WF frail SKIN: warm and  Dry without overt rashes, tattoos and scars  HEAD and Neck: without JVD, Head and scalp: normal Eyes:No scleral icterus. Fundi normal, eye movements normal. Ears: Auricle normal, canal normal, Tympanic membranes normal, insufflation normal. Nose: normal Throat: normal Neck & thyroid: normal  CHEST & LUNGS: Chest wall: normal Lungs: Clear  CVS: Reveals the PMI to be normally located. Regular rhythm, First and Second Heart sounds are normal,  absence of murmurs, rubs or gallops. Peripheral vasculature: Radial pulses: normal Dorsal pedis pulses: normal Posterior pulses: normal  ABDOMEN:  Appearance: normal Benign, no organomegaly, no masses, no Abdominal Aortic enlargement. No Guarding , no rebound. No Bruits. Bowel sounds: normal  RECTAL: N/A GU: N/A  EXTREMETIES: nonedematous.  MUSCULOSKELETAL:  Spine: normal Joints: left Valgus deformity from the left femur fracture Degenerative arthritis of the knees and hips. unstaedy gait  NEUROLOGIC: oriented to time,place and person; nonfocal. Strength is reduced 4/5 of the hip flexors. Ambulates with a cane Sensory is normal Cranial Nerves are normal.  ASSESSMENT: Anxiety - Plan: ALPRAZolam (XANAX) 0.5 MG tablet  DM (diabetes mellitus) - Plan: metFORMIN (GLUCOPHAGE) 500 MG tablet, POCT glycosylated hemoglobin (Hb A1C), CMP14+EGFR, NMR,  lipoprofile  GERD (gastroesophageal reflux disease)  Gout - Plan: allopurinol (ZYLOPRIM) 100 MG tablet, Uric acid  Glaucoma  HTN (hypertension) - Plan: lisinopril-hydrochlorothiazide (PRINZIDE,ZESTORETIC) 20-25 MG per tablet, felodipine (PLENDIL) 10 MG 24 hr tablet, CMP14+EGFR  HLD (hyperlipidemia) - Plan: rosuvastatin (CRESTOR) 10 MG tablet, NMR, lipoprofile  Osteoporosis  Pulmonary fibrosis  Stroke  Tricuspid regurgitation  Colon polyps  Unspecified vitamin D deficiency - Plan: Vit D  25 hydroxy (rtn osteoporosis monitoring) patient here with a non family caretaker.  PLAN: Orders Placed This Encounter  Procedures  . CMP14+EGFR  . NMR, lipoprofile  . Uric acid  . Vit D  25 hydroxy (rtn osteoporosis monitoring)  . POCT glycosylated hemoglobin (Hb A1C)   Meds ordered this encounter  Medications  . DISCONTD: felodipine (PLENDIL) 10 MG 24 hr tablet    Sig:   . DISCONTD: lisinopril-hydrochlorothiazide (PRINZIDE,ZESTORETIC) 20-25 MG per tablet    Sig:   . Multiple Vitamin (MULTIVITAMIN) tablet    Sig: Take 1 tablet  by mouth daily.  Marland Kitchen DISCONTD: allopurinol (ZYLOPRIM) 100 MG tablet    Sig: Take 100 mg by mouth daily.  Marland Kitchen DISCONTD: rosuvastatin (CRESTOR) 10 MG tablet    Sig: Take 10 mg by mouth daily.  Marland Kitchen lisinopril-hydrochlorothiazide (PRINZIDE,ZESTORETIC) 20-25 MG per tablet    Sig: Take 1 tablet by mouth daily.    Dispense:  30 tablet    Refill:  11  . ALPRAZolam (XANAX) 0.5 MG tablet    Sig: TAKE  (1)  TABLET  THREE TIMES DAILY. prn    Dispense:  90 tablet    Refill:  1  . metFORMIN (GLUCOPHAGE) 500 MG tablet    Sig: TAKE 1 TABLET IN MORNING    Dispense:  30 tablet    Refill:  11    NTBS for future refills  . felodipine (PLENDIL) 10 MG 24 hr tablet    Sig: Take 1 tablet (10 mg total) by mouth daily.    Dispense:  30 tablet    Refill:  11  . allopurinol (ZYLOPRIM) 100 MG tablet    Sig: Take 1 tablet (100 mg total) by mouth daily.    Dispense:  30 tablet     Refill:  11  . rosuvastatin (CRESTOR) 10 MG tablet    Sig: Take 1 tablet (10 mg total) by mouth daily.    Dispense:  30 tablet    Refill:  11   Medications Discontinued During This Encounter  Medication Reason  . lisinopril-hydrochlorothiazide (PRINZIDE,ZESTORETIC) 20-25 MG per tablet Reorder  . ALPRAZolam (XANAX) 0.5 MG tablet Reorder  . metFORMIN (GLUCOPHAGE) 500 MG tablet Reorder  . felodipine (PLENDIL) 10 MG 24 hr tablet Reorder  . allopurinol (ZYLOPRIM) 100 MG tablet Reorder  . rosuvastatin (CRESTOR) 10 MG tablet Reorder    Keep active. Fall risk discussed. Patient to use her walker that she has.  Keep active under supervision.  Return in about 4 months (around 08/11/2013) for Recheck medical problems.  Janera Peugh P. [place] Charon, M.D.

## 2013-04-13 ENCOUNTER — Other Ambulatory Visit: Payer: Self-pay | Admitting: Family Medicine

## 2013-04-13 DIAGNOSIS — M109 Gout, unspecified: Secondary | ICD-10-CM

## 2013-04-13 LAB — CMP14+EGFR
ALT: 16 IU/L (ref 0–32)
AST: 26 IU/L (ref 0–40)
Albumin/Globulin Ratio: 2 (ref 1.1–2.5)
Albumin: 4.6 g/dL (ref 3.5–4.7)
Alkaline Phosphatase: 58 IU/L (ref 39–117)
BUN/Creatinine Ratio: 28 — ABNORMAL HIGH (ref 11–26)
BUN: 24 mg/dL (ref 8–27)
CO2: 28 mmol/L (ref 18–29)
Calcium: 9.5 mg/dL (ref 8.6–10.2)
Chloride: 98 mmol/L (ref 97–108)
Creatinine, Ser: 0.86 mg/dL (ref 0.57–1.00)
GFR calc Af Amer: 70 mL/min/{1.73_m2} (ref >59)
GFR calc non Af Amer: 61 mL/min/{1.73_m2} (ref >59)
Globulin, Total: 2.3 g/dL (ref 1.5–4.5)
Glucose: 131 mg/dL — ABNORMAL HIGH (ref 65–99)
Potassium: 4.7 mmol/L (ref 3.5–5.2)
Sodium: 144 mmol/L (ref 134–144)
Total Bilirubin: 0.2 mg/dL (ref 0.0–1.2)
Total Protein: 6.9 g/dL (ref 6.0–8.5)

## 2013-04-13 LAB — NMR, LIPOPROFILE
Cholesterol: 153 mg/dL (ref <200)
HDL Cholesterol by NMR: 57 mg/dL (ref >=40)
HDL Particle Number: 34.9 umol/L (ref >=30.5)
LDL Particle Number: 864 nmol/L (ref <1000)
LDL Size: 21.1 nm (ref >20.5)
LDLC SERPL CALC-MCNC: 63 mg/dL (ref <100)
LP-IR Score: 39 (ref <=45)
Small LDL Particle Number: <90 nmol/L (ref <=527)
Triglycerides by NMR: 163 mg/dL — ABNORMAL HIGH (ref <150)

## 2013-04-13 LAB — VITAMIN D 25 HYDROXY (VIT D DEFICIENCY, FRACTURES): Vit D, 25-Hydroxy: 38 ng/mL (ref 30.0–100.0)

## 2013-04-13 LAB — URIC ACID: Uric Acid: 7 mg/dL (ref 2.5–7.1)

## 2013-04-13 MED ORDER — ALLOPURINOL 100 MG PO TABS: 200.0000 mg | ORAL_TABLET | Freq: Every day | ORAL | Status: DC

## 2013-05-12 ENCOUNTER — Other Ambulatory Visit: Payer: Self-pay | Admitting: Nurse Practitioner

## 2013-05-13 NOTE — Telephone Encounter (Signed)
Called into pharmacy. Patient aware. 

## 2013-06-01 ENCOUNTER — Encounter: Payer: Self-pay | Admitting: Family Medicine

## 2013-06-02 NOTE — Telephone Encounter (Signed)
Last seen by [place] Charon 04/11/13, last filled  This encounter was created in error - please disregard.

## 2013-06-13 ENCOUNTER — Other Ambulatory Visit: Payer: Self-pay | Admitting: Family Medicine

## 2013-06-14 NOTE — Telephone Encounter (Signed)
Last filled 05/12/13, last seen 04/11/13, Route to pool A if approved, call into [facility]

## 2013-06-14 NOTE — Telephone Encounter (Signed)
Rx ready for nurse to Phone in. 

## 2013-06-14 NOTE — Telephone Encounter (Signed)
[facility] notified of refill for alprazolam

## 2013-06-14 NOTE — Telephone Encounter (Signed)
done

## 2013-07-14 ENCOUNTER — Other Ambulatory Visit: Payer: Self-pay | Admitting: Family Medicine

## 2013-07-17 ENCOUNTER — Other Ambulatory Visit: Payer: Self-pay | Admitting: *Deleted

## 2013-07-17 NOTE — Telephone Encounter (Signed)
Last seen 04/11/13  FPW  If approved route to nurse to call into [facility] 

## 2013-07-17 NOTE — Telephone Encounter (Signed)
Last filled 06/14/13, last seen 04/11/13. Route to nurse to call into [facility]

## 2013-07-18 NOTE — Telephone Encounter (Signed)
Already filled on 12/26.

## 2013-07-18 NOTE — Telephone Encounter (Signed)
Called in.

## 2013-07-18 NOTE — Telephone Encounter (Signed)
Rx ready for nurse to Phone in. 

## 2013-07-24 ENCOUNTER — Ambulatory Visit: Payer: Medicare Other | Admitting: Family Medicine

## 2013-08-11 ENCOUNTER — Ambulatory Visit: Payer: Medicare Other | Admitting: Family Medicine

## 2013-08-21 ENCOUNTER — Telehealth: Payer: Self-pay | Admitting: Family Medicine

## 2013-08-21 ENCOUNTER — Other Ambulatory Visit: Payer: Self-pay | Admitting: Family Medicine

## 2013-08-22 NOTE — Telephone Encounter (Signed)
Last seen 04/11/13  FPW  If approved route to nurse to call into [facility]

## 2013-08-23 ENCOUNTER — Other Ambulatory Visit: Payer: Self-pay | Admitting: *Deleted

## 2013-08-23 ENCOUNTER — Telehealth: Payer: Self-pay | Admitting: Family Medicine

## 2013-08-23 NOTE — Telephone Encounter (Signed)
Patient last seen in office on 04-11-13. Rx last filled on 07-14-13 for #[phone]. Per fax from pharmacy patient states that she "dropped down since while getting water and lost most of tabs". Please advise. If approved please route to Pool A so nurse can phone in to [facility]

## 2013-08-24 NOTE — Telephone Encounter (Signed)
Please advise 

## 2013-08-25 ENCOUNTER — Other Ambulatory Visit: Payer: Self-pay | Admitting: *Deleted

## 2013-08-25 ENCOUNTER — Encounter (INDEPENDENT_AMBULATORY_CARE_PROVIDER_SITE_OTHER): Payer: Medicare Other

## 2013-08-25 MED ORDER — ALPRAZOLAM 0.5 MG PO TABS
ORAL_TABLET | ORAL | Status: DC
Start: 2013-08-25 — End: 2013-09-18

## 2013-08-25 NOTE — Telephone Encounter (Signed)
Rx ready for nurse to Phone in. 

## 2013-08-25 NOTE — Telephone Encounter (Signed)
rx called for alprazolam to [facility] pharmacy and pt aware

## 2013-08-25 NOTE — Telephone Encounter (Signed)
rx alprazolam called to [facility] --pt aware

## 2013-09-18 ENCOUNTER — Other Ambulatory Visit: Payer: Self-pay | Admitting: Family Medicine

## 2013-09-20 NOTE — Telephone Encounter (Signed)
Patient needs to be seen. Has exceeded time since last visit. Limited quantity refilled. Needs to bring all medications to next appointment.   

## 2013-09-20 NOTE — Telephone Encounter (Signed)
Last seen 04/11/13  FPW  If approved route to nurse to call into [facility] 

## 2013-09-22 ENCOUNTER — Encounter: Payer: Self-pay | Admitting: Nurse Practitioner

## 2013-09-22 ENCOUNTER — Ambulatory Visit (INDEPENDENT_AMBULATORY_CARE_PROVIDER_SITE_OTHER): Payer: Medicare Other | Admitting: Nurse Practitioner

## 2013-09-22 VITALS — BP 118/58 | HR 63 | Temp 96.8°F | Ht 64.0 in | Wt 113.0 lb

## 2013-09-22 DIAGNOSIS — K219 Gastro-esophageal reflux disease without esophagitis: Secondary | ICD-10-CM

## 2013-09-22 DIAGNOSIS — F411 Generalized anxiety disorder: Secondary | ICD-10-CM

## 2013-09-22 DIAGNOSIS — I079 Rheumatic tricuspid valve disease, unspecified: Secondary | ICD-10-CM

## 2013-09-22 DIAGNOSIS — E119 Type 2 diabetes mellitus without complications: Secondary | ICD-10-CM

## 2013-09-22 DIAGNOSIS — I6350 Cerebral infarction due to unspecified occlusion or stenosis of unspecified cerebral artery: Secondary | ICD-10-CM

## 2013-09-22 DIAGNOSIS — F419 Anxiety disorder, unspecified: Secondary | ICD-10-CM

## 2013-09-22 DIAGNOSIS — I10 Essential (primary) hypertension: Secondary | ICD-10-CM

## 2013-09-22 DIAGNOSIS — I639 Cerebral infarction, unspecified: Secondary | ICD-10-CM

## 2013-09-22 DIAGNOSIS — E785 Hyperlipidemia, unspecified: Secondary | ICD-10-CM

## 2013-09-22 DIAGNOSIS — I071 Rheumatic tricuspid insufficiency: Secondary | ICD-10-CM

## 2013-09-22 DIAGNOSIS — M109 Gout, unspecified: Secondary | ICD-10-CM

## 2013-09-22 LAB — POCT GLYCOSYLATED HEMOGLOBIN (HGB A1C)

## 2013-09-22 MED ORDER — ALLOPURINOL 100 MG PO TABS: 200.0000 mg | ORAL_TABLET | Freq: Every day | ORAL | Status: DC

## 2013-09-22 MED ORDER — METFORMIN HCL 500 MG PO TABS: ORAL_TABLET | ORAL | Status: DC

## 2013-09-22 MED ORDER — ROSUVASTATIN CALCIUM 10 MG PO TABS: 10.0000 mg | ORAL_TABLET | Freq: Every day | ORAL | Status: DC

## 2013-09-22 MED ORDER — FELODIPINE ER 10 MG PO TB24
10.0000 mg | ORAL_TABLET | Freq: Every day | ORAL | Status: DC
Start: 2013-09-22 — End: 2014-06-04

## 2013-09-22 MED ORDER — ALPRAZOLAM 0.5 MG PO TABS: ORAL_TABLET | ORAL | Status: DC

## 2013-09-22 MED ORDER — LISINOPRIL-HYDROCHLOROTHIAZIDE 20-25 MG PO TABS: 1.0000 | ORAL_TABLET | Freq: Every day | ORAL | Status: DC

## 2013-09-22 NOTE — Progress Notes (Signed)
   Subjective:    Patient ID: Mary Church, female    DOB: 1922/08/06, 80 y.o.   MRN: [id number]  HPI Patient in today for follow up of chronic medical problems. She is doing well without comlaints. Patient Active Problem List   Diagnosis Date Noted  . Anxiety   . Uterine cancer   . DM (diabetes mellitus)   . HLD (hyperlipidemia)   . HTN (hypertension)   . Hip fracture, left   . Pulmonary fibrosis   . Osteoporosis   . GERD (gastroesophageal reflux disease)   . Gout   . Glaucoma   . Tricuspid regurgitation   . Colon polyps   . Stroke    Outpatient Encounter Prescriptions as of 09/22/2013  Medication Sig  . allopurinol (ZYLOPRIM) 100 MG tablet Take 2 tablets (200 mg total) by mouth daily.  Marland Kitchen ALPRAZolam (XANAX) 0.5 MG tablet TAKE  (1)  TABLET  THREE TIMES DAILY.  . cholecalciferol (VITAMIN D) 1000 UNITS tablet Take 1,000 Units by mouth daily.  . felodipine (PLENDIL) 10 MG 24 hr tablet Take 1 tablet (10 mg total) by mouth daily.  Marland Kitchen glucose blood (TRUETRACK TEST) test strip Use as instructed  . lisinopril-hydrochlorothiazide (PRINZIDE,ZESTORETIC) 20-25 MG per tablet Take 1 tablet by mouth daily.  . metFORMIN (GLUCOPHAGE) 500 MG tablet TAKE 1 TABLET IN MORNING  . methylcellulose (ARTIFICIAL TEARS) 1 % ophthalmic solution Place 1 drop into both eyes daily.  . Multiple Vitamin (MULTIVITAMIN) tablet Take 1 tablet by mouth daily.  . rosuvastatin (CRESTOR) 10 MG tablet Take 1 tablet (10 mg total) by mouth daily.       Review of Systems  Constitutional: Negative.   HENT: Negative.   Eyes: Negative.   Respiratory: Negative.   Cardiovascular: Negative.   Genitourinary: Negative.   Neurological: Negative.   All other systems reviewed and are negative.       Objective:   Physical Exam  Constitutional: She is oriented to person, place, and time. She appears well-developed and well-nourished.  HENT:  Nose: Nose normal.  Mouth/Throat: Oropharynx is clear and moist.  Eyes: EOM  are normal.  Neck: Trachea normal, normal range of motion and full passive range of motion without pain. Neck supple. No JVD present. Carotid bruit is not present. No thyromegaly present.  Cardiovascular: Normal rate, regular rhythm, normal heart sounds and intact distal pulses.  Exam reveals no gallop and no friction rub.   No murmur heard. Pulmonary/Chest: Effort normal and breath sounds normal.  Abdominal: Soft. Bowel sounds are normal. She exhibits no distension and no mass. There is no tenderness.  Musculoskeletal: Normal range of motion.  Lymphadenopathy:    She has no cervical adenopathy.  Neurological: She is alert and oriented to person, place, and time. She has normal reflexes.  Skin: Skin is warm and dry.  Psychiatric: She has a normal mood and affect. Her behavior is normal. Judgment and thought content normal.    BP 118/58  Pulse 63  Temp(Src) 96.8 F (36 C) (Oral)  Ht 5\' 4"  (1.626 m)  Wt 113 lb (51.256 kg)  BMI 19.39 kg/m2      Assessment & Plan:

## 2013-09-22 NOTE — Patient Instructions (Signed)
Diabetes and Foot Care Diabetes may cause you to have problems because of poor blood supply (circulation) to your feet and legs. This may cause the skin on your feet to become thinner, break easier, and heal more slowly. Your skin may become dry, and the skin may peel and crack. You may also have nerve damage in your legs and feet causing decreased feeling in them. You may not notice minor injuries to your feet that could lead to infections or more serious problems. Taking care of your feet is one of the most important things you can do for yourself.  HOME CARE INSTRUCTIONS  Wear shoes at all times, even in the house. Do not go barefoot. Bare feet are easily injured.  Check your feet daily for blisters, cuts, and redness. If you cannot see the bottom of your feet, use a mirror or ask someone for help.  Wash your feet with warm water (do not use hot water) and mild soap. Then pat your feet and the areas between your toes until they are completely dry. Do not soak your feet as this can dry your skin.  Apply a moisturizing lotion or petroleum jelly (that does not contain alcohol and is unscented) to the skin on your feet and to dry, brittle toenails. Do not apply lotion between your toes.  Trim your toenails straight across. Do not dig under them or around the cuticle. File the edges of your nails with an emery board or nail file.  Do not cut corns or calluses or try to remove them with medicine.  Wear clean socks or stockings every day. Make sure they are not too tight. Do not wear knee-high stockings since they may decrease blood flow to your legs.  Wear shoes that fit properly and have enough cushioning. To break in new shoes, wear them for just a few hours a day. This prevents you from injuring your feet. Always look in your shoes before you put them on to be sure there are no objects inside.  Do not cross your legs. This may decrease the blood flow to your feet.  If you find a minor scrape,  cut, or break in the skin on your feet, keep it and the skin around it clean and dry. These areas may be cleansed with mild soap and water. Do not cleanse the area with peroxide, alcohol, or iodine.  When you remove an adhesive bandage, be sure not to damage the skin around it.  If you have a wound, look at it several times a day to make sure it is healing.  Do not use heating pads or hot water bottles. They may burn your skin. If you have lost feeling in your feet or legs, you may not know it is happening until it is too late.  Make sure your health care provider performs a complete foot exam at least annually or more often if you have foot problems. Report any cuts, sores, or bruises to your health care provider immediately. SEEK MEDICAL CARE IF:   You have an injury that is not healing.  You have cuts or breaks in the skin.  You have an ingrown nail.  You notice redness on your legs or feet.  You feel burning or tingling in your legs or feet.  You have pain or cramps in your legs and feet.  Your legs or feet are numb.  Your feet always feel cold. SEEK IMMEDIATE MEDICAL CARE IF:   There is increasing redness,   swelling, or pain in or around a wound.  There is a red line that goes up your leg.  Pus is coming from a wound.  You develop a fever or as directed by your health care provider.  You notice a bad smell coming from an ulcer or wound. Document Released: 07/03/2000 Document Revised: 03/08/2013 Document Reviewed: 12/13/2012 Exit[employer] Patient Information 2014 [employer].  

## 2013-09-24 LAB — NMR, LIPOPROFILE
Cholesterol: 159 mg/dL (ref <200)
HDL CHOLESTEROL BY NMR: 61 mg/dL (ref >=40)
HDL Particle Number: 35.7 umol/L (ref >=30.5)
LDL PARTICLE NUMBER: 927 nmol/L (ref <1000)
LDL Size: 21.2 nm (ref >20.5)
LDLC SERPL CALC-MCNC: 68 mg/dL (ref <100)
LP-IR SCORE: 35 (ref <=45)
Small LDL Particle Number: <90 nmol/L (ref <=527)
Triglycerides by NMR: 150 mg/dL — ABNORMAL HIGH (ref <150)

## 2013-09-24 LAB — CMP14+EGFR
A/G RATIO: 2.1 (ref 1.1–2.5)
ALT: 21 IU/L (ref 0–32)
AST: 28 IU/L (ref 0–40)
Albumin: 4.6 g/dL (ref 3.5–4.7)
Alkaline Phosphatase: 70 IU/L (ref 39–117)
BUN/Creatinine Ratio: 29 — ABNORMAL HIGH (ref 11–26)
BUN: 21 mg/dL (ref 8–27)
CALCIUM: 9.9 mg/dL (ref 8.7–10.3)
CO2: 29 mmol/L (ref 18–29)
Chloride: 99 mmol/L (ref 97–108)
Creatinine, Ser: 0.72 mg/dL (ref 0.57–1.00)
GFR calc Af Amer: 86 mL/min/{1.73_m2} (ref >59)
GFR calc non Af Amer: 74 mL/min/{1.73_m2} (ref >59)
GLUCOSE: 105 mg/dL — AB (ref 65–99)
Globulin, Total: 2.2 g/dL (ref 1.5–4.5)
POTASSIUM: 4.6 mmol/L (ref 3.5–5.2)
Sodium: 143 mmol/L (ref 134–144)
Total Bilirubin: 0.2 mg/dL (ref 0.0–1.2)
Total Protein: 6.8 g/dL (ref 6.0–8.5)

## 2013-10-12 ENCOUNTER — Other Ambulatory Visit: Payer: Self-pay | Admitting: Nurse Practitioner

## 2013-11-17 ENCOUNTER — Other Ambulatory Visit: Payer: Self-pay | Admitting: Family Medicine

## 2013-11-20 NOTE — Telephone Encounter (Signed)
Please call in xanax with 1 refills 

## 2013-11-20 NOTE — Telephone Encounter (Signed)
Last filled 10/21/13, last seen 09/22/13. Call into [facility]

## 2014-01-01 ENCOUNTER — Ambulatory Visit (INDEPENDENT_AMBULATORY_CARE_PROVIDER_SITE_OTHER): Payer: Medicare Other | Admitting: Nurse Practitioner

## 2014-01-01 ENCOUNTER — Telehealth: Payer: Self-pay | Admitting: Nurse Practitioner

## 2014-01-01 ENCOUNTER — Encounter: Payer: Self-pay | Admitting: Nurse Practitioner

## 2014-01-01 ENCOUNTER — Ambulatory Visit: Payer: Medicare Other | Admitting: Nurse Practitioner

## 2014-01-01 VITALS — BP 104/53 | HR 56 | Temp 97.8°F | Ht 64.0 in | Wt 115.5 lb

## 2014-01-01 DIAGNOSIS — Z23 Encounter for immunization: Secondary | ICD-10-CM

## 2014-01-01 DIAGNOSIS — I10 Essential (primary) hypertension: Secondary | ICD-10-CM

## 2014-01-01 DIAGNOSIS — E785 Hyperlipidemia, unspecified: Secondary | ICD-10-CM

## 2014-01-01 DIAGNOSIS — E119 Type 2 diabetes mellitus without complications: Secondary | ICD-10-CM

## 2014-01-01 DIAGNOSIS — E1165 Type 2 diabetes mellitus with hyperglycemia: Principal | ICD-10-CM

## 2014-01-01 DIAGNOSIS — IMO0001 Reserved for inherently not codable concepts without codable children: Secondary | ICD-10-CM

## 2014-01-01 LAB — POCT UA - MICROALBUMIN: Microalbumin Ur, POC: 20 mg/L

## 2014-01-01 LAB — POCT GLYCOSYLATED HEMOGLOBIN (HGB A1C): HEMOGLOBIN A1C: 6.2

## 2014-01-01 NOTE — Progress Notes (Signed)
Subjective:    Patient ID: Mary Church, female    DOB: 1922/09/14, 80 y.o.   MRN: [id number]  Hypertension This is a chronic problem. The current episode started more than 1 year ago. The problem is unchanged. The problem is controlled. Pertinent negatives include no blurred vision, headaches, neck pain, palpitations, PND or sweats. There are no associated agents to hypertension. Risk factors for coronary artery disease include diabetes mellitus, dyslipidemia and post-menopausal state. Past treatments include ACE inhibitors and calcium channel blockers. The current treatment provides moderate improvement. Compliance problems include diet and exercise.   Hyperlipidemia This is a chronic problem. The current episode started more than 1 year ago. The problem is uncontrolled. Recent lipid tests were reviewed and are high. She has no history of diabetes, hypothyroidism or obesity. Current antihyperlipidemic treatment includes statins. The current treatment provides moderate improvement of lipids. Compliance problems include adherence to diet and adherence to exercise.  Risk factors for coronary artery disease include diabetes mellitus, dyslipidemia, hypertension and post-menopausal.  Diabetes She presents for her follow-up diabetic visit. She has type 2 diabetes mellitus. No MedicAlert identification noted. Pertinent negatives for hypoglycemia include no headaches or sweats. Pertinent negatives for diabetes include no blurred vision. Symptoms are stable. Risk factors for coronary artery disease include dyslipidemia, diabetes mellitus, hypertension and post-menopausal. When asked about current treatments, none (stopped her metformin) were reported. She is compliant with treatment most of the time. She has not had a previous visit with a dietician. (Does not check blood sugars at home) An ACE inhibitor/angiotensin II receptor blocker is being taken. She does not see a podiatrist.Eye exam is not current.    GOUT Allopurinol keeps flare ups under control- has not had any recent flare-ups.   Review of Systems  Eyes: Negative for blurred vision.  Cardiovascular: Negative for palpitations and PND.  Musculoskeletal: Negative for neck pain.  Neurological: Negative for headaches.       Objective:   Physical Exam  Constitutional: She is oriented to person, place, and time. She appears well-developed and well-nourished.  HENT:  Nose: Nose normal.  Mouth/Throat: Oropharynx is clear and moist.  Eyes: EOM are normal.  Neck: Trachea normal, normal range of motion and full passive range of motion without pain. Neck supple. No JVD present. Carotid bruit is not present. No thyromegaly present.  Cardiovascular: Normal rate, regular rhythm, normal heart sounds and intact distal pulses.  Exam reveals no gallop and no friction rub.   No murmur heard. Pulmonary/Chest: Effort normal and breath sounds normal.  Abdominal: Soft. Bowel sounds are normal. She exhibits no distension and no mass. There is no tenderness.  Musculoskeletal: Normal range of motion.  Lymphadenopathy:    She has no cervical adenopathy.  Neurological: She is alert and oriented to person, place, and time. She has normal reflexes.  Skin: Skin is warm and dry.  Psychiatric: She has a normal mood and affect. Her behavior is normal. Judgment and thought content normal.   BP 104/53  Pulse 56  Temp(Src) 97.8 F (36.6 C) (Oral)  Ht $R'5\' 4"'OO$  (1.626 m)  Wt 115 lb 8 oz (52.39 kg)  BMI 19.82 kg/m2  Results for orders placed in visit on 01/01/14  POCT GLYCOSYLATED HEMOGLOBIN (HGB A1C)      Result Value Ref Range   Hemoglobin A1C 6.2    POCT UA - MICROALBUMIN      Result Value Ref Range   Microalbumin Ur, POC 20  Assessment & Plan:   1. Type II or unspecified type diabetes mellitus without mention of complication, uncontrolled   2. Hypertension   3. Hyperlipemia   4. DM (diabetes mellitus)   5. Need for pneumococcal  vaccine    Orders Placed This Encounter  Procedures  . Pneumococcal conjugate vaccine 13-valent  . CMP14+EGFR  . NMR, lipoprofile  . Microalbumin, urine  . POCT glycosylated hemoglobin (Hb A1C)  . POCT UA - Microalbumin    Watch carbs since no longer taking metformin Labs pending Health maintenance reviewed Diet and exercise encouraged Continue all meds Follow up  In 3 months   [place], FNP

## 2014-01-01 NOTE — Patient Instructions (Addendum)
Fall Prevention and Home Safety Falls cause injuries and can affect all age groups. It is possible to use preventive measures to significantly decrease the likelihood of falls. There are many simple measures which can make your home safer and prevent falls. OUTDOORS  Repair cracks and edges of walkways and driveways.  Remove high doorway thresholds.  Trim shrubbery on the main path into your home.  Have good outside lighting.  Clear walkways of tools, rocks, debris, and clutter.  Check that handrails are not broken and are securely fastened. Both sides of steps should have handrails.  Have leaves, snow, and ice cleared regularly.  Use sand or salt on walkways during winter months.  In the garage, clean up grease or oil spills. BATHROOM  Install night lights.  Install grab bars by the toilet and in the tub and shower.  Use non-skid mats or decals in the tub or shower.  Place a plastic non-slip stool in the shower to sit on, if needed.  Keep floors dry and clean up all water on the floor immediately.  Remove soap buildup in the tub or shower on a regular basis.  Secure bath mats with non-slip, double-sided rug tape.  Remove throw rugs and tripping hazards from the floors. BEDROOMS  Install night lights.  Make sure a bedside light is easy to reach.  Do not use oversized bedding.  Keep a telephone by your bedside.  Have a firm chair with side arms to use for getting dressed.  Remove throw rugs and tripping hazards from the floor. KITCHEN  Keep handles on pots and pans turned toward the center of the stove. Use back burners when possible.  Clean up spills quickly and allow time for drying.  Avoid walking on wet floors.  Avoid hot utensils and knives.  Position shelves so they are not too high or low.  Place commonly used objects within easy reach.  If necessary, use a sturdy step stool with a grab bar when reaching.  Keep electrical cables out of the  way.  Do not use floor polish or wax that makes floors slippery. If you must use wax, use non-skid floor wax.  Remove throw rugs and tripping hazards from the floor. STAIRWAYS  Never leave objects on stairs.  Place handrails on both sides of stairways and use them. Fix any loose handrails. Make sure handrails on both sides of the stairways are as long as the stairs.  Check carpeting to make sure it is firmly attached along stairs. Make repairs to worn or loose carpet promptly.  Avoid placing throw rugs at the top or bottom of stairways, or properly secure the rug with carpet tape to prevent slippage. Get rid of throw rugs, if possible.  Have an electrician put in a light switch at the top and bottom of the stairs. OTHER FALL PREVENTION TIPS  Wear low-heel or rubber-soled shoes that are supportive and fit well. Wear closed toe shoes.  When using a stepladder, make sure it is fully opened and both spreaders are firmly locked. Do not climb a closed stepladder.  Add color or contrast paint or tape to grab bars and handrails in your home. Place contrasting color strips on first and last steps.  Learn and use mobility aids as needed. Install an electrical emergency response system.  Turn on lights to avoid dark areas. Replace light bulbs that burn out immediately. Get light switches that glow.  Arrange furniture to create clear pathways. Keep furniture in the same place.    Firmly attach carpet with non-skid or double-sided tape.  Eliminate uneven floor surfaces.  Select a carpet pattern that does not visually hide the edge of steps.  Be aware of all pets. OTHER HOME SAFETY TIPS  Set the water temperature for 120 F (48.8 C).  Keep emergency numbers on or near the telephone.  Keep smoke detectors on every level of the home and near sleeping areas. Document Released: 06/26/2002 Document Revised: 01/05/2012 Document Reviewed: 09/25/2011 [facility] Patient Information 2014  [place].    Pneumococcal Vaccine, Polyvalent suspension for injection What is this medicine? PNEUMOCOCCAL VACCINE, POLYVALENT (NEU mo KOK al vak SEEN, pol ee VEY luhnt) is a vaccine to prevent pneumococcus bacteria infection. These bacteria are a major cause of ear infections, 'Strep throat' infections, and serious pneumonia, meningitis, or blood infections worldwide. These vaccines help the body to produce antibodies (protective substances) that help your body defend against these bacteria. This vaccine is recommended for infants and young children. This vaccine will not treat an infection. This medicine may be used for other purposes; ask your health care provider or pharmacist if you have questions. COMMON BRAND NAME(S): Prevnar 13 , Prevnar What should I tell my health care provider before I take this medicine? They need to know if you have any of these conditions: -bleeding problems -fever -immune system problems -low platelet count in the blood -seizures -an unusual or allergic reaction to pneumococcal vaccine, diphtheria toxoid, other vaccines, latex, other medicines, foods, dyes, or preservatives -pregnant or trying to get pregnant -breast-feeding How should I use this medicine? This vaccine is for injection into a muscle. It is given by a health care professional. A copy of Vaccine Information Statements will be given before each vaccination. Read this sheet carefully each time. The sheet may change frequently. Talk to your pediatrician regarding the use of this medicine in children. While this drug may be prescribed for children as young as 95 weeks old for selected conditions, precautions do apply. Overdosage: If you think you have taken too much of this medicine contact a poison control center or emergency room at once. NOTE: This medicine is only for you. Do not share this medicine with others. What if I miss a dose? It is important not to miss your dose. Call your doctor  or health care professional if you are unable to keep an appointment. What may interact with this medicine? -medicines for cancer chemotherapy -medicines that suppress your immune function -medicines that treat or prevent blood clots like warfarin, enoxaparin, and dalteparin -steroid medicines like prednisone or cortisone This list may not describe all possible interactions. Give your health care provider a list of all the medicines, herbs, non-prescription drugs, or dietary supplements you use. Also tell them if you smoke, drink alcohol, or use illegal drugs. Some items may interact with your medicine. What should I watch for while using this medicine? Mild fever and pain should go away in 3 days or less. Report any unusual symptoms to your doctor or health care professional. What side effects may I notice from receiving this medicine? Side effects that you should report to your doctor or health care professional as soon as possible: -allergic reactions like skin rash, itching or hives, swelling of the face, lips, or tongue -breathing problems -confused -fever over 102 degrees F -pain, tingling, numbness in the hands or feet -seizures -unusual bleeding or bruising -unusual muscle weakness Side effects that usually do not require medical attention (report to your doctor or health care  professional if they continue or are bothersome): -aches and pains -diarrhea -fever of 102 degrees F or less -headache -irritable -loss of appetite -pain, tender at site where injected -trouble sleeping This list may not describe all possible side effects. Call your doctor for medical advice about side effects. You may report side effects to FDA at 1-800-FDA-1088. Where should I keep my medicine? This does not apply. This vaccine is given in a clinic, pharmacy, doctor's office, or other health care setting and will not be stored at home. NOTE: This sheet is a summary. It may not cover all possible  information. If you have questions about this medicine, talk to your doctor, pharmacist, or health care provider.  2014, [employer] Standard. (2008-09-18 10:17:22)

## 2014-01-02 LAB — NMR, LIPOPROFILE
CHOLESTEROL: 136 mg/dL (ref 100–199)
HDL CHOLESTEROL BY NMR: 53 mg/dL (ref >39)
HDL PARTICLE NUMBER: 35.9 umol/L (ref >=30.5)
LDL PARTICLE NUMBER: 685 nmol/L (ref <1000)
LDL Size: 20.6 nm (ref >20.5)
LDLC SERPL CALC-MCNC: 50 mg/dL (ref 0–99)
LP-IR Score: 45 (ref <=45)
Small LDL Particle Number: 467 nmol/L (ref <=527)
TRIGLYCERIDES BY NMR: 165 mg/dL — AB (ref 0–149)

## 2014-01-02 LAB — CMP14+EGFR
A/G RATIO: 2.4 (ref 1.1–2.5)
ALT: 29 IU/L (ref 0–32)
AST: 30 IU/L (ref 0–40)
Albumin: 4.4 g/dL (ref 3.5–4.7)
Alkaline Phosphatase: 64 IU/L (ref 39–117)
BUN/Creatinine Ratio: 31 — ABNORMAL HIGH (ref 11–26)
BUN: 31 mg/dL — AB (ref 8–27)
CALCIUM: 9.5 mg/dL (ref 8.7–10.3)
CO2: 28 mmol/L (ref 18–29)
Chloride: 99 mmol/L (ref 97–108)
Creatinine, Ser: 1 mg/dL (ref 0.57–1.00)
GFR calc Af Amer: 58 mL/min/{1.73_m2} — ABNORMAL LOW (ref >59)
GFR, EST NON AFRICAN AMERICAN: 50 mL/min/{1.73_m2} — AB (ref >59)
GLOBULIN, TOTAL: 1.8 g/dL (ref 1.5–4.5)
Glucose: 138 mg/dL — ABNORMAL HIGH (ref 65–99)
Potassium: 4.7 mmol/L (ref 3.5–5.2)
Sodium: 141 mmol/L (ref 134–144)
Total Bilirubin: 0.2 mg/dL (ref 0.0–1.2)
Total Protein: 6.2 g/dL (ref 6.0–8.5)

## 2014-01-02 LAB — MICROALBUMIN, URINE: Microalbumin, Urine: 24.7 ug/mL — ABNORMAL HIGH (ref 0.0–17.0)

## 2014-01-05 NOTE — Telephone Encounter (Signed)
I have talked with Joycelyn Schmid regarding back brace in the past, she will discuss with provider if  She wishes to persue at next visit.

## 2014-01-24 ENCOUNTER — Other Ambulatory Visit: Payer: Self-pay | Admitting: Nurse Practitioner

## 2014-01-25 ENCOUNTER — Ambulatory Visit (INDEPENDENT_AMBULATORY_CARE_PROVIDER_SITE_OTHER): Payer: Medicare Other | Admitting: Nurse Practitioner

## 2014-01-25 ENCOUNTER — Encounter: Payer: Self-pay | Admitting: Nurse Practitioner

## 2014-01-25 VITALS — BP 120/40 | HR 55 | Temp 98.0°F | Ht 64.0 in | Wt 115.0 lb

## 2014-01-25 DIAGNOSIS — R269 Unspecified abnormalities of gait and mobility: Secondary | ICD-10-CM

## 2014-01-25 DIAGNOSIS — R2681 Unsteadiness on feet: Secondary | ICD-10-CM

## 2014-01-25 DIAGNOSIS — F411 Generalized anxiety disorder: Secondary | ICD-10-CM

## 2014-01-25 MED ORDER — CITALOPRAM HYDROBROMIDE 20 MG PO TABS: 20.0000 mg | ORAL_TABLET | Freq: Every day | ORAL | Status: DC

## 2014-01-25 NOTE — Telephone Encounter (Signed)
Last refill 12/22/13. Last ov 6/15. If approved call to [facility]

## 2014-01-25 NOTE — Progress Notes (Signed)
   Subjective:    Patient ID: Mary Church, female    DOB: [birth year], 80 y.o.   MRN: [id number]  HPI Patient is on xanax for anxiety- Patient not sure why she is on it- SHe is currently taking xanax 0.5mg  TID- HSe doesn't like taking- If she doesn't take it then she can't walk because she gets so shaky. Patient just wants  To try a different nerve pill.  * unsteady gait- family is afraid she will fall  Review of Systems  Constitutional: Negative.   Respiratory: Negative.   Cardiovascular: Negative.   Genitourinary: Negative.   Neurological: Negative.   Psychiatric/Behavioral: Negative.   All other systems reviewed and are negative.      Objective:   Physical Exam  Constitutional: She is oriented to person, place, and time. She appears well-developed and well-nourished.  Cardiovascular: Normal rate, regular rhythm and normal heart sounds.   Pulmonary/Chest: Effort normal and breath sounds normal.  Neurological: She is alert and oriented to person, place, and time.  Skin: Skin is warm and dry.  Psychiatric: She has a normal mood and affect. Her behavior is normal. Judgment and thought content normal.   BP 120/40  Pulse 55  Temp(Src) 98 F (36.7 C) (Oral)  Ht 5\' 4"  (1.626 m)  Wt 115 lb (52.164 kg)  BMI 19.73 kg/m2        Assessment & Plan:    1. GAD (generalized anxiety disorder) Patient will wean off of xanax slowly- discussed with family - citalopram (CELEXA) 20 MG tablet; Take 1 tablet (20 mg total) by mouth daily.  Dispense: 30 tablet; Refill: 3  2. Unsteady gait Fall prevention discussed - Ambulatory referral to Physical Therapy   [place], [place]

## 2014-01-25 NOTE — Patient Instructions (Signed)

## 2014-01-29 NOTE — Progress Notes (Signed)
Referral sent to [facility].

## 2014-02-01 ENCOUNTER — Telehealth: Payer: Self-pay | Admitting: Nurse Practitioner

## 2014-02-01 NOTE — Telephone Encounter (Signed)
ok 

## 2014-02-09 ENCOUNTER — Other Ambulatory Visit: Payer: Self-pay | Admitting: Nurse Practitioner

## 2014-02-13 ENCOUNTER — Telehealth: Payer: Self-pay | Admitting: Nurse Practitioner

## 2014-02-13 ENCOUNTER — Other Ambulatory Visit: Payer: Self-pay | Admitting: Nurse Practitioner

## 2014-02-13 MED ORDER — ALPRAZOLAM 0.5 MG PO TABS: ORAL_TABLET | ORAL | Status: DC

## 2014-02-13 NOTE — Progress Notes (Signed)
Called patient-n/a.

## 2014-02-15 NOTE — Telephone Encounter (Signed)
This has already been taken care of- we called in xanax and I told her she needed to stay on celexa for a few more weeks

## 2014-04-04 ENCOUNTER — Ambulatory Visit (INDEPENDENT_AMBULATORY_CARE_PROVIDER_SITE_OTHER): Payer: Medicare Other | Admitting: Nurse Practitioner

## 2014-04-04 ENCOUNTER — Encounter: Payer: Self-pay | Admitting: Nurse Practitioner

## 2014-04-04 VITALS — BP 121/55 | HR 54 | Temp 96.8°F | Ht 64.0 in | Wt 114.2 lb

## 2014-04-04 DIAGNOSIS — E119 Type 2 diabetes mellitus without complications: Secondary | ICD-10-CM

## 2014-04-04 DIAGNOSIS — M810 Age-related osteoporosis without current pathological fracture: Secondary | ICD-10-CM

## 2014-04-04 DIAGNOSIS — F419 Anxiety disorder, unspecified: Secondary | ICD-10-CM

## 2014-04-04 DIAGNOSIS — F411 Generalized anxiety disorder: Secondary | ICD-10-CM

## 2014-04-04 DIAGNOSIS — I10 Essential (primary) hypertension: Secondary | ICD-10-CM

## 2014-04-04 DIAGNOSIS — E785 Hyperlipidemia, unspecified: Secondary | ICD-10-CM

## 2014-04-04 DIAGNOSIS — Z23 Encounter for immunization: Secondary | ICD-10-CM

## 2014-04-04 DIAGNOSIS — K219 Gastro-esophageal reflux disease without esophagitis: Secondary | ICD-10-CM

## 2014-04-04 LAB — POCT GLYCOSYLATED HEMOGLOBIN (HGB A1C)

## 2014-04-04 MED ORDER — ALPRAZOLAM 0.5 MG PO TABS: ORAL_TABLET | ORAL | Status: DC

## 2014-04-04 MED ORDER — METFORMIN HCL 500 MG PO TABS: ORAL_TABLET | ORAL | Status: DC

## 2014-04-04 NOTE — Progress Notes (Addendum)
Subjective:    Patient ID: Mary Church, female    DOB: 23-Jul-1922, 80 y.o.   MRN: [id number]  Patient here today for follow up of chronic medical problems. Doing well today without complaints.  Hypertension This is a chronic problem. The current episode started more than 1 year ago. The problem is unchanged. The problem is controlled. Pertinent negatives include no blurred vision, headaches, neck pain, palpitations, PND or sweats. There are no associated agents to hypertension. Risk factors for coronary artery disease include diabetes mellitus, dyslipidemia and post-menopausal state. Past treatments include ACE inhibitors and calcium channel blockers. The current treatment provides moderate improvement. Compliance problems include diet and exercise.   Hyperlipidemia This is a chronic problem. The current episode started more than 1 year ago. The problem is uncontrolled. Recent lipid tests were reviewed and are high. She has no history of diabetes, hypothyroidism or obesity. Current antihyperlipidemic treatment includes statins. The current treatment provides moderate improvement of lipids. Compliance problems include adherence to diet and adherence to exercise.  Risk factors for coronary artery disease include diabetes mellitus, dyslipidemia, hypertension and post-menopausal.  Diabetes She presents for her follow-up diabetic visit. She has type 2 diabetes mellitus. No MedicAlert identification noted. Pertinent negatives for hypoglycemia include no headaches or sweats. Pertinent negatives for diabetes include no blurred vision. Symptoms are stable. Risk factors for coronary artery disease include dyslipidemia, diabetes mellitus, hypertension and post-menopausal. When asked about current treatments, none (stopped her metformin) were reported. She is compliant with treatment most of the time. She has not had a previous visit with a dietician. (Does not check blood sugars at home) An ACE  inhibitor/angiotensin II receptor blocker is being taken. She does not see a podiatrist.Eye exam is not current.  GOUT Allopurinol keeps flare ups under control- has not had any recent flare-ups. GERD Controls with diet. Depression/GAD Currently on celexa and xanax- combination works well for her- no c/o side effects    Review of Systems  Eyes: Negative for blurred vision.  Cardiovascular: Negative for palpitations and PND.  Musculoskeletal: Negative for neck pain.  Neurological: Negative for headaches.       Objective:   Physical Exam  Constitutional: She is oriented to person, place, and time. She appears well-developed and well-nourished.  HENT:  Nose: Nose normal.  Mouth/Throat: Oropharynx is clear and moist.  Eyes: EOM are normal.  Neck: Trachea normal, normal range of motion and full passive range of motion without pain. Neck supple. No JVD present. Carotid bruit is not present. No thyromegaly present.  Cardiovascular: Normal rate, regular rhythm, normal heart sounds and intact distal pulses.  Exam reveals no gallop and no friction rub.   No murmur heard. Pulmonary/Chest: Effort normal and breath sounds normal.  Abdominal: Soft. Bowel sounds are normal. She exhibits no distension and no mass. There is no tenderness.  Musculoskeletal: Normal range of motion.  Lymphadenopathy:    She has no cervical adenopathy.  Neurological: She is alert and oriented to person, place, and time. She has normal reflexes.  Skin: Skin is warm and dry.  Psychiatric: She has a normal mood and affect. Her behavior is normal. Judgment and thought content normal.   BP 121/55  Pulse 54  Temp(Src) 96.8 F (36 C) (Oral)  Ht $R'5\' 4"'IO$  (1.626 m)  Wt 114 lb 3.2 oz (51.801 kg)  BMI 19.59 kg/m2   Results for orders placed in visit on 04/04/14  POCT GLYCOSYLATED HEMOGLOBIN (HGB A1C)      Result Value Ref  Range   Hemoglobin A1C 7.5%           Assessment & Plan:   1. Type 2 diabetes mellitus  without complication Watch carbs- stricter diet - POCT glycosylated hemoglobin (Hb A1C) - metFORMIN (GLUCOPHAGE) 500 MG tablet; TAKE 1 TABLET IN MORNING  Dispense: 30 tablet; Refill: 11  2. Essential hypertension Avoid salt in diet - CMP14+EGFR  3. HLD (hyperlipidemia) Low fat idet - NMR, lipoprofile  4. Anxiety Stress management - ALPRAZolam (XANAX) 0.5 MG tablet; TAKE  (1)  TABLET  THREE TIMES DAILY.  Dispense: 90 tablet; Refill: 0  5. Gastroesophageal reflux disease without esophagitis  6. Osteoporosis Weight bearing exercises if can tolerate - DG Bone Density; Future   Labs pending Health maintenance reviewed Diet and exercise encouraged Continue all meds Follow up  In 3 months   [place], FNP

## 2014-04-04 NOTE — Patient Instructions (Signed)

## 2014-04-04 NOTE — Addendum Note (Signed)
Addended by: Chevis Pretty on: 04/04/2014 02:20 PM   Modules accepted: Orders

## 2014-04-04 NOTE — Addendum Note (Signed)
Addended by: Waverly Ferrari on: 04/04/2014 02:40 PM   Modules accepted: Orders

## 2014-04-05 LAB — CMP14+EGFR
A/G RATIO: 1.9 (ref 1.1–2.5)
ALBUMIN: 4.4 g/dL (ref 3.5–4.7)
ALT: 22 IU/L (ref 0–32)
AST: 27 IU/L (ref 0–40)
Alkaline Phosphatase: 58 IU/L (ref 39–117)
BILIRUBIN TOTAL: 0.2 mg/dL (ref 0.0–1.2)
BUN/Creatinine Ratio: 35 — ABNORMAL HIGH (ref 11–26)
BUN: 28 mg/dL — AB (ref 8–27)
CO2: 28 mmol/L (ref 18–29)
CREATININE: 0.81 mg/dL (ref 0.57–1.00)
Calcium: 9.5 mg/dL (ref 8.7–10.3)
Chloride: 97 mmol/L (ref 97–108)
GFR, EST AFRICAN AMERICAN: 74 mL/min/{1.73_m2} (ref >59)
GFR, EST NON AFRICAN AMERICAN: 65 mL/min/{1.73_m2} (ref >59)
GLUCOSE: 80 mg/dL (ref 65–99)
Globulin, Total: 2.3 g/dL (ref 1.5–4.5)
Potassium: 4.5 mmol/L (ref 3.5–5.2)
Sodium: 140 mmol/L (ref 134–144)
TOTAL PROTEIN: 6.7 g/dL (ref 6.0–8.5)

## 2014-04-05 LAB — NMR, LIPOPROFILE
CHOLESTEROL: 138 mg/dL (ref 100–199)
HDL CHOLESTEROL BY NMR: 65 mg/dL (ref >39)
HDL Particle Number: 36.9 umol/L (ref >=30.5)
LDL PARTICLE NUMBER: 408 nmol/L (ref <1000)
LDL Size: 21.4 nm (ref >20.5)
LDLC SERPL CALC-MCNC: 55 mg/dL (ref 0–99)
LP-IR Score: 30 (ref <=45)
Small LDL Particle Number: 154 nmol/L (ref <=527)
TRIGLYCERIDES BY NMR: 92 mg/dL (ref 0–149)

## 2014-04-06 ENCOUNTER — Other Ambulatory Visit: Payer: Self-pay | Admitting: Nurse Practitioner

## 2014-04-12 ENCOUNTER — Telehealth: Payer: Self-pay | Admitting: Family Medicine

## 2014-04-17 NOTE — Telephone Encounter (Signed)
Rescheduled patient and letter sent with appointment date/time

## 2014-04-18 ENCOUNTER — Ambulatory Visit: Payer: Medicare Other

## 2014-04-18 ENCOUNTER — Other Ambulatory Visit: Payer: Medicare Other

## 2014-04-24 ENCOUNTER — Encounter: Payer: Self-pay | Admitting: *Deleted

## 2014-04-27 ENCOUNTER — Telehealth: Payer: Self-pay | Admitting: Family Medicine

## 2014-04-27 NOTE — Telephone Encounter (Signed)
appt changed per patient request

## 2014-05-07 ENCOUNTER — Other Ambulatory Visit: Payer: Self-pay | Admitting: Nurse Practitioner

## 2014-05-08 NOTE — Telephone Encounter (Signed)
Last refill 04/09/14. Last ov 04/04/14. If approved call to [facility].

## 2014-05-08 NOTE — Telephone Encounter (Signed)
Please call in xanax with 1 refills 

## 2014-05-08 NOTE — Telephone Encounter (Signed)
Called in.

## 2014-05-28 ENCOUNTER — Encounter (HOSPITAL_COMMUNITY): Payer: Self-pay | Admitting: Emergency Medicine

## 2014-05-28 ENCOUNTER — Inpatient Hospital Stay (HOSPITAL_COMMUNITY)
Admission: EM | Admit: 2014-05-28 | Discharge: 2014-06-04 | DRG: 871 | Disposition: A | Discharge status: Hospice | Payer: PRIVATE HEALTH INSURANCE | Attending: Family Medicine | Admitting: Family Medicine

## 2014-05-28 ENCOUNTER — Emergency Department (HOSPITAL_COMMUNITY): Payer: PRIVATE HEALTH INSURANCE

## 2014-05-28 DIAGNOSIS — Z801 Family history of malignant neoplasm of trachea, bronchus and lung: Secondary | ICD-10-CM

## 2014-05-28 DIAGNOSIS — R0902 Hypoxemia: Secondary | ICD-10-CM

## 2014-05-28 DIAGNOSIS — H409 Unspecified glaucoma: Secondary | ICD-10-CM | POA: Diagnosis present

## 2014-05-28 DIAGNOSIS — I071 Rheumatic tricuspid insufficiency: Secondary | ICD-10-CM | POA: Diagnosis present

## 2014-05-28 DIAGNOSIS — G9340 Encephalopathy, unspecified: Secondary | ICD-10-CM | POA: Diagnosis present

## 2014-05-28 DIAGNOSIS — J8410 Pulmonary fibrosis, unspecified: Secondary | ICD-10-CM | POA: Diagnosis present

## 2014-05-28 DIAGNOSIS — R911 Solitary pulmonary nodule: Secondary | ICD-10-CM | POA: Diagnosis present

## 2014-05-28 DIAGNOSIS — R296 Repeated falls: Secondary | ICD-10-CM | POA: Diagnosis present

## 2014-05-28 DIAGNOSIS — M810 Age-related osteoporosis without current pathological fracture: Secondary | ICD-10-CM | POA: Diagnosis present

## 2014-05-28 DIAGNOSIS — E872 Acidosis: Secondary | ICD-10-CM | POA: Diagnosis present

## 2014-05-28 DIAGNOSIS — J9690 Respiratory failure, unspecified, unspecified whether with hypoxia or hypercapnia: Secondary | ICD-10-CM

## 2014-05-28 DIAGNOSIS — J9601 Acute respiratory failure with hypoxia: Secondary | ICD-10-CM | POA: Diagnosis not present

## 2014-05-28 DIAGNOSIS — D649 Anemia, unspecified: Secondary | ICD-10-CM | POA: Diagnosis present

## 2014-05-28 DIAGNOSIS — Z833 Family history of diabetes mellitus: Secondary | ICD-10-CM | POA: Diagnosis not present

## 2014-05-28 DIAGNOSIS — E119 Type 2 diabetes mellitus without complications: Secondary | ICD-10-CM | POA: Diagnosis present

## 2014-05-28 DIAGNOSIS — Z8673 Personal history of transient ischemic attack (TIA), and cerebral infarction without residual deficits: Secondary | ICD-10-CM | POA: Diagnosis not present

## 2014-05-28 DIAGNOSIS — A419 Sepsis, unspecified organism: Principal | ICD-10-CM

## 2014-05-28 DIAGNOSIS — E785 Hyperlipidemia, unspecified: Secondary | ICD-10-CM | POA: Diagnosis present

## 2014-05-28 DIAGNOSIS — R6510 Systemic inflammatory response syndrome (SIRS) of non-infectious origin without acute organ dysfunction: Secondary | ICD-10-CM | POA: Diagnosis present

## 2014-05-28 DIAGNOSIS — K219 Gastro-esophageal reflux disease without esophagitis: Secondary | ICD-10-CM | POA: Diagnosis present

## 2014-05-28 DIAGNOSIS — Z515 Encounter for palliative care: Secondary | ICD-10-CM

## 2014-05-28 DIAGNOSIS — F419 Anxiety disorder, unspecified: Secondary | ICD-10-CM | POA: Diagnosis present

## 2014-05-28 DIAGNOSIS — I4891 Unspecified atrial fibrillation: Secondary | ICD-10-CM | POA: Diagnosis present

## 2014-05-28 DIAGNOSIS — IMO0001 Reserved for inherently not codable concepts without codable children: Secondary | ICD-10-CM | POA: Insufficient documentation

## 2014-05-28 DIAGNOSIS — J189 Pneumonia, unspecified organism: Secondary | ICD-10-CM | POA: Diagnosis present

## 2014-05-28 DIAGNOSIS — I10 Essential (primary) hypertension: Secondary | ICD-10-CM | POA: Diagnosis present

## 2014-05-28 DIAGNOSIS — F17220 Nicotine dependence, chewing tobacco, uncomplicated: Secondary | ICD-10-CM | POA: Diagnosis present

## 2014-05-28 DIAGNOSIS — R531 Weakness: Secondary | ICD-10-CM | POA: Diagnosis present

## 2014-05-28 DIAGNOSIS — Z66 Do not resuscitate: Secondary | ICD-10-CM | POA: Diagnosis present

## 2014-05-28 LAB — CBC WITH DIFFERENTIAL/PLATELET
BASOS ABS: 0 10*3/uL (ref 0.0–0.1)
Basophils Relative: 0 % (ref 0–1)
EOS PCT: 0 % (ref 0–5)
Eosinophils Absolute: 0 10*3/uL (ref 0.0–0.7)
HCT: 31.3 % — ABNORMAL LOW (ref 36.0–46.0)
Hemoglobin: 10.7 g/dL — ABNORMAL LOW (ref 12.0–15.0)
LYMPHS ABS: 0.7 10*3/uL (ref 0.7–4.0)
Lymphocytes Relative: 5 % — ABNORMAL LOW (ref 12–46)
MCH: 31.4 pg (ref 26.0–34.0)
MCHC: 34.2 g/dL (ref 30.0–36.0)
MCV: 91.8 fL (ref 78.0–100.0)
Monocytes Absolute: 1.1 10*3/uL — ABNORMAL HIGH (ref 0.1–1.0)
Monocytes Relative: 8 % (ref 3–12)
NEUTROS ABS: 11.8 10*3/uL — AB (ref 1.7–7.7)
NEUTROS PCT: 87 % — AB (ref 43–77)
PLATELETS: 165 10*3/uL (ref 150–400)
RBC: 3.41 MIL/uL — AB (ref 3.87–5.11)
RDW: 13.4 % (ref 11.5–15.5)
WBC: 13.5 10*3/uL — ABNORMAL HIGH (ref 4.0–10.5)

## 2014-05-28 LAB — URINE MICROSCOPIC-ADD ON

## 2014-05-28 LAB — BASIC METABOLIC PANEL
Anion gap: 19 — ABNORMAL HIGH (ref 5–15)
BUN: 27 mg/dL — AB (ref 6–23)
CO2: 23 mEq/L (ref 19–32)
CREATININE: 1.09 mg/dL (ref 0.50–1.10)
Calcium: 8.9 mg/dL (ref 8.4–10.5)
Chloride: 93 mEq/L — ABNORMAL LOW (ref 96–112)
GFR, EST AFRICAN AMERICAN: 51 mL/min — AB (ref >90)
GFR, EST NON AFRICAN AMERICAN: 44 mL/min — AB (ref >90)
GLUCOSE: 225 mg/dL — AB (ref 70–99)
POTASSIUM: 3.7 meq/L (ref 3.7–5.3)
SODIUM: 135 meq/L — AB (ref 137–147)

## 2014-05-28 LAB — URINALYSIS, ROUTINE W REFLEX MICROSCOPIC
BILIRUBIN URINE: NEGATIVE
GLUCOSE, UA: NEGATIVE mg/dL
Ketones, ur: NEGATIVE mg/dL
Leukocytes, UA: NEGATIVE
Nitrite: NEGATIVE
Protein, ur: NEGATIVE mg/dL
UROBILINOGEN UA: 0.2 mg/dL (ref 0.0–1.0)
pH: 5 (ref 5.0–8.0)

## 2014-05-28 LAB — CK: Total CK: 285 U/L — ABNORMAL HIGH (ref 7–177)

## 2014-05-28 LAB — I-STAT CG4 LACTIC ACID, ED
LACTIC ACID, VENOUS: 4.85 mmol/L — AB (ref 0.5–2.2)
LACTIC ACID, VENOUS: 1.38 mmol/L (ref 0.5–2.2)

## 2014-05-28 LAB — I-STAT TROPONIN, ED: TROPONIN I, POC: 0 ng/mL (ref 0.00–0.08)

## 2014-05-28 MED ORDER — DEXTROSE 5 % IV SOLN
500.0000 mg | Freq: Once | INTRAVENOUS | Status: AC
  Administered 2014-05-29: 500 mg via INTRAVENOUS
  Filled 2014-05-28: qty 500

## 2014-05-28 MED ORDER — SODIUM CHLORIDE 0.9 % IV BOLUS (SEPSIS)
2000.0000 mL | Freq: Once | INTRAVENOUS | Status: AC
  Administered 2014-05-28: 2000 mL via INTRAVENOUS

## 2014-05-28 MED ORDER — DEXTROSE 5 % IV SOLN
1.0000 g | Freq: Once | INTRAVENOUS | Status: AC
  Administered 2014-05-28: 1 g via INTRAVENOUS
  Filled 2014-05-28: qty 10

## 2014-05-28 NOTE — ED Provider Notes (Signed)
CSN: [id number]     Arrival date & time 05/28/14  1820 History   First MD Initiated Contact with Patient 05/28/14 1830     Chief Complaint  Patient presents with  . Loss of Consciousness     (Consider location/radiation/quality/duration/timing/severity/associated sxs/prior Treatment) HPI 80 year old female lives at home alone and uses a walker or cane if needed for ambulation states felt generally weak this afternoon so laid down on the floor so she would not fall, she denies lightheadedness or syncope, denies trauma, denies headache or neck pain, denies back pain or chest pain, denies cough or shortness of breath, denies vomiting or diarrhea, denies rash or dysuria, denies abdominal pain, denies vertigo, denies change in speech or vision, denies lateralizing weakness or numbness, she states she was just generally weak and did not want to fall and get hurt if she lay down on the floor and when she did that she was too weak to get herself back up again. She activated her alert badge to call for help. Patient has no pain and states she did not fall and did not have syncope despite the initial triage note.The patient estimates she was on the floor for the last couple hours prior to arrival. When asked what EMS meant by hypotensive the ED nurse states EMS said the initial systolic blood pressure was 90 and it has risen spontaneously without intervention since then. Past Medical History  Diagnosis Date  . Pulmonary fibrosis     seen on X-ray   . Hip fracture, left 2010  . Anxiety   . Uterine cancer 1976  . Diabetes mellitus   . HLD (hyperlipidemia)   . HTN (hypertension)   . Osteoporosis   . GERD (gastroesophageal reflux disease)   . Gout   . Glaucoma   . Tricuspid regurgitation   . Colon polyps   . Stroke 2012   Past Surgical History  Procedure Laterality Date  . Bilateral oophorectomy    . Orif acetabular fracture    . Abdominal hysterectomy    . Foot surgery    . Tonsillectomy    .  Cataract extraction     Family History  Problem Relation Age of Onset  . Lung cancer Brother   . Diabetes Maternal Aunt    History  Substance Use Topics  . Smoking status: Never Smoker   . Smokeless tobacco: Current User    Types: Snuff  . Alcohol Use: No   OB History    No data available     Review of Systems 10 Systems reviewed and are negative for acute change except as noted in the HPI.   Allergies  Ace inhibitors; Bactrim; and Lipitor  Home Medications   Prior to Admission medications   Medication Sig Start Date End Date Taking? Authorizing Provider  LORazepam (ATIVAN) 0.5 MG tablet Place 1 tablet (0.5 mg total) under the tongue every 4 (four) hours as needed for anxiety. 06/04/14   Samuella Cota, MD  Morphine Sulfate (MORPHINE CONCENTRATE) 10 MG/0.5ML SOLN concentrated solution Place 0.25 mLs (5 mg total) under the tongue every hour as needed for severe pain. 06/04/14   Samuella Cota, MD   BP 155/75 mmHg  Pulse 48  Temp(Src) 98.8 F (37.1 C) (Axillary)  Resp 18  Ht 5\' 4"  (1.626 m)  Wt 121 lb 0.5 oz (54.9 kg)  BMI 20.76 kg/m2  SpO2 68% Physical Exam  Constitutional: She is oriented to person, place, and time.  Awake, alert, nontoxic appearance with  baseline speech for patient.  HENT:  Head: Atraumatic.  Mouth/Throat: No oropharyngeal exudate.  Eyes: EOM are normal. Pupils are equal, round, and reactive to light. Right eye exhibits no discharge. Left eye exhibits no discharge.  Neck: Neck supple.  Cardiovascular: Normal rate and regular rhythm.   No murmur heard. Pulmonary/Chest: Effort normal and breath sounds normal. No stridor. No respiratory distress. She has no wheezes. She has no rales. She exhibits no tenderness.  Abdominal: Soft. Bowel sounds are normal. She exhibits no mass. There is no tenderness. There is no rebound.  Musculoskeletal: She exhibits no tenderness.  Baseline ROM, moves extremities with no obvious new focal weakness.   Lymphadenopathy:    She has no cervical adenopathy.  Neurological: She is alert and oriented to person, place, and time.  Awake, alert, cooperative and aware of situation; motor strength 4/5 bilaterally appears to be baseline general weakness; sensation normal to light touch bilaterally; peripheral visual fields full to confrontation; no facial asymmetry; tongue midline; major cranial nerves appear intact; no pronator drift in arms or legs, normal finger to nose bilaterally  Skin: No rash noted.  Psychiatric: She has a normal mood and affect.  Nursing note and vitals reviewed.   ED Course  Procedures (including critical care time) Initial IVF bolus at least 43ml/kg while CXR and labs pending, even though no fever or focal symptoms to suggest sepsis.  On recheck Pt adds she has been coughing for several days. Patient / Family / Caregiver informed of clinical course, understand medical decision-making process, and agree with plan.  Labs Review Labs Reviewed  CBC WITH DIFFERENTIAL - Abnormal; Notable for the following:    WBC 13.5 (*)    RBC 3.41 (*)    Hemoglobin 10.7 (*)    HCT 31.3 (*)    Neutrophils Relative % 87 (*)    Neutro Abs 11.8 (*)    Lymphocytes Relative 5 (*)    Monocytes Absolute 1.1 (*)    All other components within normal limits  BASIC METABOLIC PANEL - Abnormal; Notable for the following:    Sodium 135 (*)    Chloride 93 (*)    Glucose, Bld 225 (*)    BUN 27 (*)    GFR calc non Af Amer 44 (*)    GFR calc Af Amer 51 (*)    Anion gap 19 (*)    All other components within normal limits  URINALYSIS, ROUTINE W REFLEX MICROSCOPIC - Abnormal; Notable for the following:    Specific Gravity, Urine >1.030 (*)    Hgb urine dipstick MODERATE (*)    All other components within normal limits  CK - Abnormal; Notable for the following:    Total CK 285 (*)    All other components within normal limits  URINE MICROSCOPIC-ADD ON - Abnormal; Notable for the following:     Squamous Epithelial / LPF FEW (*)    Bacteria, UA FEW (*)    All other components within normal limits  CBC WITH DIFFERENTIAL - Abnormal; Notable for the following:    WBC 12.5 (*)    RBC 3.30 (*)    Hemoglobin 10.3 (*)    HCT 29.9 (*)    Neutrophils Relative % 86 (*)    Neutro Abs 10.6 (*)    Lymphocytes Relative 6 (*)    Monocytes Absolute 1.1 (*)    All other components within normal limits  BASIC METABOLIC PANEL - Abnormal; Notable for the following:    Sodium 136 (*)  Potassium 3.6 (*)    Glucose, Bld 115 (*)    BUN 26 (*)    GFR calc non Af Amer 58 (*)    GFR calc Af Amer 67 (*)    All other components within normal limits  GLUCOSE, CAPILLARY - Abnormal; Notable for the following:    Glucose-Capillary 106 (*)    All other components within normal limits  GLUCOSE, CAPILLARY - Abnormal; Notable for the following:    Glucose-Capillary 198 (*)    All other components within normal limits  GLUCOSE, CAPILLARY - Abnormal; Notable for the following:    Glucose-Capillary 147 (*)    All other components within normal limits  GLUCOSE, CAPILLARY - Abnormal; Notable for the following:    Glucose-Capillary 143 (*)    All other components within normal limits  GLUCOSE, CAPILLARY - Abnormal; Notable for the following:    Glucose-Capillary 161 (*)    All other components within normal limits  GLUCOSE, CAPILLARY - Abnormal; Notable for the following:    Glucose-Capillary 155 (*)    All other components within normal limits  BLOOD GAS, ARTERIAL - Abnormal; Notable for the following:    pH, Arterial 7.342 (*)    pO2, Arterial 41.2 (*)    Acid-base deficit 4.8 (*)    Allens test (pass/fail) NOT INDICATED (*)    All other components within normal limits  BASIC METABOLIC PANEL - Abnormal; Notable for the following:    Potassium 3.4 (*)    Glucose, Bld 131 (*)    BUN 35 (*)    GFR calc non Af Amer 47 (*)    GFR calc Af Amer 54 (*)    Anion gap 16 (*)    All other components within  normal limits  CBC - Abnormal; Notable for the following:    WBC 15.0 (*)    RBC 3.02 (*)    Hemoglobin 9.4 (*)    HCT 26.8 (*)    All other components within normal limits  GLUCOSE, CAPILLARY - Abnormal; Notable for the following:    Glucose-Capillary 146 (*)    All other components within normal limits  BLOOD GAS, ARTERIAL - Abnormal; Notable for the following:    pCO2 arterial 33.1 (*)    pO2, Arterial 57.8 (*)    All other components within normal limits  GLUCOSE, CAPILLARY - Abnormal; Notable for the following:    Glucose-Capillary 156 (*)    All other components within normal limits  GLUCOSE, CAPILLARY - Abnormal; Notable for the following:    Glucose-Capillary 222 (*)    All other components within normal limits  GLUCOSE, CAPILLARY - Abnormal; Notable for the following:    Glucose-Capillary 109 (*)    All other components within normal limits  GLUCOSE, CAPILLARY - Abnormal; Notable for the following:    Glucose-Capillary 133 (*)    All other components within normal limits  BASIC METABOLIC PANEL - Abnormal; Notable for the following:    Potassium 3.4 (*)    Glucose, Bld 129 (*)    BUN 33 (*)    GFR calc non Af Amer 72 (*)    GFR calc Af Amer 84 (*)    All other components within normal limits  CBC - Abnormal; Notable for the following:    WBC 13.3 (*)    RBC 3.15 (*)    Hemoglobin 9.8 (*)    HCT 27.9 (*)    All other components within normal limits  GLUCOSE, CAPILLARY - Abnormal; Notable  for the following:    Glucose-Capillary 119 (*)    All other components within normal limits  GLUCOSE, CAPILLARY - Abnormal; Notable for the following:    Glucose-Capillary 159 (*)    All other components within normal limits  GLUCOSE, CAPILLARY - Abnormal; Notable for the following:    Glucose-Capillary 112 (*)    All other components within normal limits  GLUCOSE, CAPILLARY - Abnormal; Notable for the following:    Glucose-Capillary 143 (*)    All other components within  normal limits  I-STAT CG4 LACTIC ACID, ED - Abnormal; Notable for the following:    Lactic Acid, Venous 4.85 (*)    All other components within normal limits  CULTURE, BLOOD (ROUTINE X 2)  CULTURE, BLOOD (ROUTINE X 2)  CULTURE, BLOOD (ROUTINE X 2)  CULTURE, BLOOD (ROUTINE X 2)  MRSA PCR SCREENING  LEGIONELLA ANTIGEN, URINE  STREP PNEUMONIAE URINARY ANTIGEN  I-STAT TROPOININ, ED  I-STAT CG4 LACTIC ACID, ED    Imaging Review No results found. Dg Chest 2 View  05/28/2014   CLINICAL DATA:  Syncope with fall  EXAM: CHEST  2 VIEW  COMPARISON:  February 17, 2011  FINDINGS: There is underlying emphysematous change. There is extensive consolidation in the region of the axillary subsegment of the right upper lobe. There is also patchy infiltrate in the right lower lobe. Interstitial fibrosis in the right base is also present. Left lung is clear except for what appears to be some fibrotic type change in the left base. Heart size and pulmonary vascularity are within normal limits. No adenopathy. No pneumothorax. There is degenerative change in the thoracic spine.  IMPRESSION: Infiltrate axillary subsegment right upper lobe and in portions of the right lower lobe. There is underlying emphysema with fibrosis in the bases. Elsewhere lungs are clear. Cardiac silhouette unremarkable given underlying emphysema.  The infiltrate in the axillary subsegment of the right upper lobe has a somewhat nodular appearance. Given this circumstance, follow-up studies to clearing advised. If this area continues to have a nodular appearance after approximately 10-14 days, correlation with noncontrast enhanced chest CT to further evaluate at that time would be reasonable.   Electronically Signed   By: Lowella Grip M.D.   On: 05/28/2014 20:43   Dg Chest Port 1 View  05/31/2014   CLINICAL DATA:  Respiratory failure  EXAM: PORTABLE CHEST - 1 VIEW  COMPARISON:  Portable chest x-ray of May 30, 2014 and May 28, 2014   FINDINGS: The lungs are well-expanded. Confluent alveolar infiltrates are present bilaterally and are little changed since yesterday's study but are considerably more pronounced than on the earlier exam. The cardiac silhouette is normal in size. The pulmonary vascularity is indistinct but not clearly engorged. There is no pleural effusion. The trachea is midline. The bony thorax exhibits no acute abnormality.  IMPRESSION: Confluent alveolar opacities are consistent with bilateral pneumonia or other alveolar filling processes including noncardiac pulmonary edema. There has been little change since yesterday's study.   Electronically Signed   By: David  Martinique   On: 05/31/2014 07:36   Dg Chest Port 1 View  05/30/2014   CLINICAL DATA:  Hypoxia. Shortness of breath. Pulmonary fibrosis. Diabetes.  EXAM: PORTABLE CHEST - 1 VIEW  COMPARISON:  05/28/2014  FINDINGS: Severe progression of bilateral airspace opacities noted, with extensive new left perihilar and basilar airspace opacities, increased in the right upper lobe opacity, and increase in the right perihilar and basilar airspace opacity. Heart size remains within normal limits.  Atherosclerotic aortic arch.  IMPRESSION: 1. Worsened considerable bilateral airspace opacities suggesting acute bilateral pneumonia, noncardiogenic edema, or pulmonary hemorrhage.   Electronically Signed   By: Sherryl Barters M.D.   On: 05/30/2014 14:29    EKG Interpretation   Date/Time:  Monday May 28 2014 18:38:48 EST Ventricular Rate:  69 PR Interval:  197 QRS Duration: 85 QT Interval:  444 QTC Calculation: 476 R Axis:   83 Text Interpretation:  Sinus rhythm Borderline right axis deviation  Consider left ventricular hypertrophy Compared to previous tracing PR  interval has decreased Confirmed by Stevie Kern  MD, Jenny Reichmann ([id number]) on 05/28/2014  7:16:37 PM      MDM   Final diagnoses:  Weakness  CAP (community acquired pneumonia)  Sepsis, due to unspecified organism     The patient appears reasonably stabilized for admission considering the current resources, flow, and capabilities available in the ED at this time, and I doubt any other [facility] requiring further screening and/or treatment in the ED prior to admission.    Babette Relic, MD 06/14/14 [phone]

## 2014-05-28 NOTE — ED Notes (Signed)
MD at bedside. 

## 2014-05-28 NOTE — ED Notes (Signed)
PT had a syncopal episode this evening with fall. EMS reported finding pt in living room floor and was alert and was hypotensive. PT denies any pain. PT was incontinent of urine and stool on arrival to ED.

## 2014-05-29 ENCOUNTER — Encounter (HOSPITAL_COMMUNITY): Payer: Self-pay | Admitting: *Deleted

## 2014-05-29 DIAGNOSIS — I10 Essential (primary) hypertension: Secondary | ICD-10-CM

## 2014-05-29 DIAGNOSIS — J8410 Pulmonary fibrosis, unspecified: Secondary | ICD-10-CM

## 2014-05-29 LAB — CBC WITH DIFFERENTIAL/PLATELET
BASOS ABS: 0 10*3/uL (ref 0.0–0.1)
Basophils Relative: 0 % (ref 0–1)
Eosinophils Absolute: 0 10*3/uL (ref 0.0–0.7)
Eosinophils Relative: 0 % (ref 0–5)
HCT: 29.9 % — ABNORMAL LOW (ref 36.0–46.0)
Hemoglobin: 10.3 g/dL — ABNORMAL LOW (ref 12.0–15.0)
LYMPHS PCT: 6 % — AB (ref 12–46)
Lymphs Abs: 0.8 10*3/uL (ref 0.7–4.0)
MCH: 31.2 pg (ref 26.0–34.0)
MCHC: 34.4 g/dL (ref 30.0–36.0)
MCV: 90.6 fL (ref 78.0–100.0)
Monocytes Absolute: 1.1 10*3/uL — ABNORMAL HIGH (ref 0.1–1.0)
Monocytes Relative: 8 % (ref 3–12)
NEUTROS ABS: 10.6 10*3/uL — AB (ref 1.7–7.7)
NEUTROS PCT: 86 % — AB (ref 43–77)
PLATELETS: 189 10*3/uL (ref 150–400)
RBC: 3.3 MIL/uL — ABNORMAL LOW (ref 3.87–5.11)
RDW: 13.3 % (ref 11.5–15.5)
WBC: 12.5 10*3/uL — AB (ref 4.0–10.5)

## 2014-05-29 LAB — BASIC METABOLIC PANEL
ANION GAP: 15 (ref 5–15)
BUN: 26 mg/dL — ABNORMAL HIGH (ref 6–23)
CALCIUM: 8.7 mg/dL (ref 8.4–10.5)
CO2: 24 meq/L (ref 19–32)
Chloride: 97 mEq/L (ref 96–112)
Creatinine, Ser: 0.86 mg/dL (ref 0.50–1.10)
GFR calc Af Amer: 67 mL/min — ABNORMAL LOW (ref >90)
GFR calc non Af Amer: 58 mL/min — ABNORMAL LOW (ref >90)
Glucose, Bld: 115 mg/dL — ABNORMAL HIGH (ref 70–99)
POTASSIUM: 3.6 meq/L — AB (ref 3.7–5.3)
SODIUM: 136 meq/L — AB (ref 137–147)

## 2014-05-29 LAB — GLUCOSE, CAPILLARY
GLUCOSE-CAPILLARY: 106 mg/dL — AB (ref 70–99)
Glucose-Capillary: 198 mg/dL — ABNORMAL HIGH (ref 70–99)
Glucose-Capillary: 147 mg/dL — ABNORMAL HIGH (ref 70–99)
Glucose-Capillary: 143 mg/dL — ABNORMAL HIGH (ref 70–99)

## 2014-05-29 MED ORDER — INSULIN ASPART 100 UNIT/ML ~~LOC~~ SOLN
0.0000 [IU] | Freq: Three times a day (TID) | SUBCUTANEOUS | Status: DC
  Administered 2014-05-29: 2 [IU] via SUBCUTANEOUS
  Administered 2014-05-29: 1 [IU] via SUBCUTANEOUS
  Administered 2014-05-30 (×2): 2 [IU] via SUBCUTANEOUS

## 2014-05-29 MED ORDER — CITALOPRAM HYDROBROMIDE 20 MG PO TABS
20.0000 mg | ORAL_TABLET | Freq: Every day | ORAL | Status: DC
  Administered 2014-05-29 – 2014-05-31 (×2): 20 mg via ORAL
  Filled 2014-05-29 (×3): qty 1

## 2014-05-29 MED ORDER — DEXTROSE 5 % IV SOLN
1.0000 g | INTRAVENOUS | Status: DC
  Administered 2014-05-29 – 2014-05-31 (×3): 1 g via INTRAVENOUS
  Filled 2014-05-29 (×6): qty 10

## 2014-05-29 MED ORDER — SODIUM CHLORIDE 0.9 % IV SOLN: INTRAVENOUS | Status: DC

## 2014-05-29 MED ORDER — SODIUM CHLORIDE 0.9 % IV SOLN
INTRAVENOUS | Status: AC
  Administered 2014-05-29: 08:00:00 via INTRAVENOUS

## 2014-05-29 MED ORDER — ALLOPURINOL 100 MG PO TABS
200.0000 mg | ORAL_TABLET | Freq: Every day | ORAL | Status: DC
  Administered 2014-05-29 – 2014-05-31 (×2): 200 mg via ORAL
  Filled 2014-05-29 (×3): qty 2

## 2014-05-29 MED ORDER — ENOXAPARIN SODIUM 30 MG/0.3ML ~~LOC~~ SOLN
30.0000 mg | SUBCUTANEOUS | Status: DC
  Administered 2014-05-29: 30 mg via SUBCUTANEOUS
  Filled 2014-05-29 (×2): qty 0.3

## 2014-05-29 MED ORDER — ALPRAZOLAM 0.5 MG PO TABS
0.5000 mg | ORAL_TABLET | Freq: Three times a day (TID) | ORAL | Status: DC | PRN
  Administered 2014-05-29: 0.5 mg via ORAL
  Filled 2014-05-29: qty 1

## 2014-05-29 MED ORDER — VITAMIN D 1000 UNITS PO TABS
1000.0000 [IU] | ORAL_TABLET | Freq: Every day | ORAL | Status: DC
  Administered 2014-05-29 – 2014-05-31 (×2): 1000 [IU] via ORAL
  Filled 2014-05-29 (×3): qty 1

## 2014-05-29 MED ORDER — FELODIPINE ER 5 MG PO TB24
10.0000 mg | ORAL_TABLET | Freq: Every day | ORAL | Status: DC
  Administered 2014-05-29 – 2014-05-31 (×2): 10 mg via ORAL
  Filled 2014-05-29 (×5): qty 2

## 2014-05-29 MED ORDER — DEXTROSE 5 % IV SOLN
500.0000 mg | INTRAVENOUS | Status: DC
  Administered 2014-05-29 – 2014-05-31 (×3): 500 mg via INTRAVENOUS
  Filled 2014-05-29 (×6): qty 500

## 2014-05-29 NOTE — Progress Notes (Signed)
Patient with temp of 100.92F Dr. Roderic Palau notified.

## 2014-05-29 NOTE — Progress Notes (Signed)
80 yo F admitted with CAP and started on Rocephin & Zithromax. She was afebrile on admission with mildly elevated WBC.  Lactic acid was elevated on admission, but repeat this morning has normalized.  Scr is elevated above normal and estimated CrCl ~25-46ml/min.  LFTs are wnl. Rocephin & Zithromax do not require dose adjustment for renal function.  Continue doses as ordered by MD. Pharmacy to sign off.  Please re-consult if needed.   Netta Cedars, PharmD, BCPS 05/29/2014@7 :44 AM

## 2014-05-29 NOTE — Care Management Note (Addendum)
    Page 1 of 1   06/01/2014     11:09:03 AM CARE MANAGEMENT NOTE 06/01/2014  Patient:  Mary Church,Mary Church   Account Number:  [id number]  Date Initiated:  05/29/2014  Documentation initiated by:  Jolene Provost  Subjective/Objective Assessment:   Pt is from home alone. Pt has neighbors who check on her. Pt has no family. Pt admitted with CAP. Pt uses a walker at home. Pt has used [facility] in the past and is interested in using them again in needed. Pt has no med needs prior to admission     Action/Plan:   Pt may need O2 assessment and PT eval prior to discharge. PT eval order is in. Will continue to follow for CM needs.   Anticipated DC Date:  05/31/2014   Anticipated DC Plan:  [place]  CM consult      Choice offered to / List presented to:             Status of service:  In process, will continue to follow Medicare Important Message given?  YES (If response is "NO", the following Medicare IM given date fields will be blank) Date Medicare IM given:  06/01/2014 Medicare IM given by:  Vladimir Creeks Date Additional Medicare IM given:   Additional Medicare IM given by:    Discharge Disposition:  [place]  Per UR Regulation:    If discussed at Long Length of Stay Meetings, dates discussed:    Comments:  06/01/2014 [place], RN, MSN, [facility]  11/12/015 [place], RN, MSN, PCCN Pt has been transfered to SD unit for placement on Bipap. Will continue to follow for discharge planning needs.  05/30/14 [place], RN BSN CM Pt very confused today and PT was unable to work with pt. Will continue to follow for discharge planning needs.  05/29/2014 0900 Jolene Provost, RN, MSN, [facility]

## 2014-05-29 NOTE — Plan of Care (Signed)
Problem: Phase I Progression Outcomes Goal: Code status addressed with pt/family Outcome: Not Met (add Reason) Patient is undecided

## 2014-05-29 NOTE — Progress Notes (Signed)
[facility] PROGRESS NOTE  Mary Church ELF:[id number] DOB: 07/07/1923 DOA: 05/28/2014 PCP: Redge Gainer, MD  Assessment/Plan: 1. Sepsis. Likely related to underlying pneumonia. Blood pressure appears to be stabilizing. Follow-up blood cultures. She is on appropriate antibiotics. 2. Community Acquired pneumonia. Continue Rocephin and azithromycin 3. Diabetes. Continue sinusitis 4. Hypertension. 5. Generalized weakness. Physical therapy evaluation. 6. Pulmonary fibrosis. Respiratory status stable at this time. No indication for steroids at this point.  Code Status: full code Family Communication: discussed with patient Disposition Plan: discharge home once improved, will likely need physical therapy evaluation   Consultants:    Procedures:    Antibiotics:  Rocephin 11/9  azithro 11/9  HPI/Subjective: No new complaints, she is coughing  Objective: Filed Vitals:   05/29/14 1453  BP: 110/39  Pulse: 67  Temp: 100.4 F (38 C)  Resp: 18    Intake/Output Summary (Last 24 hours) at 05/29/14 1925 Last data filed at 05/29/14 1753  Gross per 24 hour  Intake    500 ml  Output    600 ml  Net   -100 ml   Filed Weights   05/28/14 1838 05/28/14 2355  Weight: 51.71 kg (114 lb) 53.9 kg (118 lb 13.3 oz)    Exam:   General:  NAD, appears acutely ill  Cardiovascular: S1, S2 RRR  Respiratory: crackles on right side  Abdomen: soft, nt, nd, bs+  Musculoskeletal: no edema b/l   Data Reviewed: Basic Metabolic Panel:  Recent Labs Lab 05/28/14 1910 05/29/14 0805  NA 135* 136*  K 3.7 3.6*  CL 93* 97  CO2 23 24  GLUCOSE 225* 115*  BUN 27* 26*  CREATININE 1.09 0.86  CALCIUM 8.9 8.7   Liver Function Tests: No results for input(s): AST, ALT, ALKPHOS, BILITOT, PROT, ALBUMIN in the last 168 hours. No results for input(s): LIPASE, AMYLASE in the last 168 hours. No results for input(s): AMMONIA in the last 168 hours. CBC:  Recent Labs Lab 05/28/14 1910  05/29/14 0805  WBC 13.5* 12.5*  NEUTROABS 11.8* 10.6*  HGB 10.7* 10.3*  HCT 31.3* 29.9*  MCV 91.8 90.6  PLT 165 189   Cardiac Enzymes:  Recent Labs Lab 05/28/14 1910  CKTOTAL 285*   BNP (last 3 results) No results for input(s): PROBNP in the last 8760 hours. CBG:  Recent Labs Lab 05/29/14 0802 05/29/14 1126 05/29/14 1636  GLUCAP 106* 198* 147*    Recent Results (from the past 240 hour(s))  Blood Culture (routine x 2)     Status: None (Preliminary result)   Collection Time: 05/28/14 11:05 PM  Result Value Ref Range Status   Specimen Description BLOOD LEFT ARM  Final   Special Requests BOTTLES DRAWN AEROBIC AND ANAEROBIC [place]  Final   Culture PENDING  Incomplete   Report Status PENDING  Incomplete  Blood Culture (routine x 2)     Status: None (Preliminary result)   Collection Time: 05/28/14 11:13 PM  Result Value Ref Range Status   Specimen Description BLOOD RIGHT HAND  Final   Special Requests   Final    BOTTLES DRAWN AEROBIC AND ANAEROBIC AEB 10CC ANA [place]   Culture PENDING  Incomplete   Report Status PENDING  Incomplete  Culture, blood (routine x 2) Call MD if unable to obtain prior to antibiotics being given     Status: None (Preliminary result)   Collection Time: 05/29/14  8:03 AM  Result Value Ref Range Status   Specimen Description Blood RIGHT ARM  Final  Special Requests   Final    BOTTLES DRAWN AEROBIC AND ANAEROBIC 6 CC EACH BOTTLE   Culture PENDING  Incomplete   Report Status PENDING  Incomplete  Culture, blood (routine x 2) Call MD if unable to obtain prior to antibiotics being given     Status: None (Preliminary result)   Collection Time: 05/29/14  8:03 AM  Result Value Ref Range Status   Specimen Description Blood LEFT ARM  Final   Special Requests   Final    BOTTLES DRAWN AEROBIC AND ANAEROBIC 6 CC EACH BOTTLE   Culture PENDING  Incomplete   Report Status PENDING  Incomplete     Studies: Dg Chest 2 View  05/28/2014   CLINICAL DATA:   Syncope with fall  EXAM: CHEST  2 VIEW  COMPARISON:  February 17, 2011  FINDINGS: There is underlying emphysematous change. There is extensive consolidation in the region of the axillary subsegment of the right upper lobe. There is also patchy infiltrate in the right lower lobe. Interstitial fibrosis in the right base is also present. Left lung is clear except for what appears to be some fibrotic type change in the left base. Heart size and pulmonary vascularity are within normal limits. No adenopathy. No pneumothorax. There is degenerative change in the thoracic spine.  IMPRESSION: Infiltrate axillary subsegment right upper lobe and in portions of the right lower lobe. There is underlying emphysema with fibrosis in the bases. Elsewhere lungs are clear. Cardiac silhouette unremarkable given underlying emphysema.  The infiltrate in the axillary subsegment of the right upper lobe has a somewhat nodular appearance. Given this circumstance, follow-up studies to clearing advised. If this area continues to have a nodular appearance after approximately 10-14 days, correlation with noncontrast enhanced chest CT to further evaluate at that time would be reasonable.   Electronically Signed   By: Lowella Grip M.D.   On: 05/28/2014 20:43    Scheduled Meds: . sodium chloride   Intravenous STAT  . allopurinol  200 mg Oral Daily  . azithromycin  500 mg Intravenous Q24H  . cefTRIAXone (ROCEPHIN)  IV  1 g Intravenous Q24H  . cholecalciferol  1,000 Units Oral Daily  . citalopram  20 mg Oral Daily  . enoxaparin (LOVENOX) injection  30 mg Subcutaneous Q24H  . felodipine  10 mg Oral Daily  . insulin aspart  0-9 Units Subcutaneous TID WC   Continuous Infusions: . sodium chloride 75 mL/hr at 05/29/14 0751    Principal Problem:   CAP (community acquired pneumonia) Active Problems:   Anxiety   Diabetes mellitus type 2, controlled, without complications   HTN (hypertension)   Pulmonary fibrosis   Tricuspid  regurgitation   SIRS (systemic inflammatory response syndrome)   Weakness generalized   Hypoxia   Sepsis    Time spent: 37mins    MEMON,JEHANZEB  [facility] Pager [phone]. If 7PM-7AM, please contact night-coverage at [email or web address], password [facility] 05/29/2014, 7:25 PM  LOS: 1 day

## 2014-05-29 NOTE — H&P (Signed)
PCP:   Redge Gainer, MD   Chief Complaint:  weakness  HPI: 80 yo female lives at home alone comes in with one week of worsening generalized weakness and coughing.  She denies any hemoptysis, no fevers or chills.  She just has not been feeling like usual.  Today she got so weak walking with her walker that she almost passed out.  Did not loc.  She went down to the floor without hurting herself and used her life alert button to call ems.  When ems arrived , her sbp was in low 90s.  dont have other vitals.  She denies dysuria, no sob.  No cp or abd pain.  No n/v/d.  She is found to have pna today.  Review of Systems:  Positive and negative as per HPI otherwise all other systems are negative  Past Medical History: Past Medical History  Diagnosis Date  . Pulmonary fibrosis     seen on X-ray   . Hip fracture, left 2010  . Anxiety   . Uterine cancer 1976  . Diabetes mellitus   . HLD (hyperlipidemia)   . HTN (hypertension)   . Osteoporosis   . GERD (gastroesophageal reflux disease)   . Gout   . Glaucoma   . Tricuspid regurgitation   . Colon polyps   . Stroke 2012   Past Surgical History  Procedure Laterality Date  . Bilateral oophorectomy    . Orif acetabular fracture    . Abdominal hysterectomy    . Foot surgery    . Tonsillectomy    . Cataract extraction      Medications: Prior to Admission medications   Medication Sig Start Date End Date Taking? Authorizing Provider  ALPRAZolam Duanne Moron) 0.5 MG tablet Take 0.5 mg by mouth 3 (three) times daily as needed for anxiety.   Yes Historical Provider, MD  cholecalciferol (VITAMIN D) 1000 UNITS tablet Take 1,000 Units by mouth daily.   Yes Historical Provider, MD  metFORMIN (GLUCOPHAGE) 500 MG tablet Take 500 mg by mouth every morning.   Yes Historical Provider, MD  methylcellulose (ARTIFICIAL TEARS) 1 % ophthalmic solution Place 1 drop into both eyes daily.   Yes Historical Provider, MD  Multiple Vitamin (MULTIVITAMIN) tablet Take  1 tablet by mouth daily.   Yes Historical Provider, MD  rosuvastatin (CRESTOR) 10 MG tablet Take 1 tablet (10 mg total) by mouth daily. Patient taking differently: Take 10 mg by mouth every other day.  09/22/13  Yes Mary-Rosely Hassell Done, FNP  allopurinol (ZYLOPRIM) 100 MG tablet Take 2 tablets (200 mg total) by mouth daily. 09/22/13   Mary-Morrisa Hassell Done, FNP  ALPRAZolam Duanne Moron) 0.5 MG tablet TAKE  (1)  TABLET  THREE TIMES DAILY. Patient not taking: Reported on 05/28/2014 05/08/14   Mary-Ariadna Hassell Done, FNP  citalopram (CELEXA) 20 MG tablet Take 1 tablet (20 mg total) by mouth daily. 01/25/14   Mary-Heena Hassell Done, FNP  felodipine (PLENDIL) 10 MG 24 hr tablet Take 1 tablet (10 mg total) by mouth daily. 09/22/13   Mary-Lailah Hassell Done, FNP  glucose blood (TRUETRACK TEST) test strip Test blood sugar once a day, Dx 250.00 Patient not taking: Reported on 05/28/2014    Mary-Rees Hassell Done, FNP  lisinopril-hydrochlorothiazide (PRINZIDE,ZESTORETIC) 20-25 MG per tablet Take 1 tablet by mouth daily. 09/22/13   Mary-Astaria Hassell Done, FNP  metFORMIN (GLUCOPHAGE) 500 MG tablet TAKE 1 TABLET IN MORNING Patient not taking: Reported on 05/28/2014 04/04/14   Mary-Jacqualin Hassell Done, FNP    Allergies:   Allergies  Allergen Reactions  .  Ace Inhibitors Cough  . Bactrim [Sulfamethoxazole-Trimethoprim] Nausea Only  . Lipitor [Atorvastatin]     myalgia    Social History:  reports that she has never smoked. Her smokeless tobacco use includes Snuff. She reports that she does not drink alcohol or use illicit drugs. no children.  Neighbor checks on her every day.  Uses walker.  Family History: Family History  Problem Relation Age of Onset  . Lung cancer Brother   . Diabetes Maternal Aunt     Physical Exam: Filed Vitals:   05/28/14 2136 05/28/14 2200 05/28/14 2230 05/28/14 2300  BP: 108/62 103/42 97/52 107/53  Pulse: 62 55    Temp: 97.7 F (36.5 C)     TempSrc: Oral     Resp: 22 19 20 21   Height:      Weight:       SpO2: 90% 95%     General appearance: alert, cooperative and no distress Head: Normocephalic, without obvious abnormality, atraumatic Eyes: negative Nose: Nares normal. Septum midline. Mucosa normal. No drainage or sinus tenderness. Neck: no JVD and supple, symmetrical, trachea midline Lungs: clear to auscultation bilaterally Heart: regular rate and rhythm, S1, S2 normal, no murmur, click, rub or gallop Abdomen: soft, non-tender; bowel sounds normal; no masses,  no organomegaly Extremities: extremities normal, atraumatic, no cyanosis or edema Pulses: 2+ and symmetric Skin: Skin color, texture, turgor normal. No rashes or lesions Neurologic: Grossly normal   Labs on Admission:   Recent Labs  05/28/14 1910  NA 135*  K 3.7  CL 93*  CO2 23  GLUCOSE 225*  BUN 27*  CREATININE 1.09  CALCIUM 8.9    Recent Labs  05/28/14 1910  WBC 13.5*  NEUTROABS 11.8*  HGB 10.7*  HCT 31.3*  MCV 91.8  PLT 165    Recent Labs  05/28/14 1910  CKTOTAL 285*   Radiological Exams on Admission: Dg Chest 2 View  05/28/2014   CLINICAL DATA:  Syncope with fall  EXAM: CHEST  2 VIEW  COMPARISON:  February 17, 2011  FINDINGS: There is underlying emphysematous change. There is extensive consolidation in the region of the axillary subsegment of the right upper lobe. There is also patchy infiltrate in the right lower lobe. Interstitial fibrosis in the right base is also present. Left lung is clear except for what appears to be some fibrotic type change in the left base. Heart size and pulmonary vascularity are within normal limits. No adenopathy. No pneumothorax. There is degenerative change in the thoracic spine.  IMPRESSION: Infiltrate axillary subsegment right upper lobe and in portions of the right lower lobe. There is underlying emphysema with fibrosis in the bases. Elsewhere lungs are clear. Cardiac silhouette unremarkable given underlying emphysema.  The infiltrate in the axillary subsegment of the  right upper lobe has a somewhat nodular appearance. Given this circumstance, follow-up studies to clearing advised. If this area continues to have a nodular appearance after approximately 10-14 days, correlation with noncontrast enhanced chest CT to further evaluate at that time would be reasonable.   Electronically Signed   By: Lowella Grip M.D.   On: 05/28/2014 20:43    Assessment/Plan  80 yo female with SIRS from CAP  Principal Problem:   CAP (community acquired pneumonia)-  pna pathway.  Place on rocephin and azithro.  bc done.  Lactic acidosis better with ivf in ED.  bp improved with ivf.  Overall improving already.  Will need repeat imaging in couple week due to the nodularity of the infiltrate  in the rul.  Active Problems:  Stable unless o/w noted   Anxiety   Diabetes mellitus type 2, controlled, without complications-  ssi.   HTN (hypertension)   Pulmonary fibrosis-  Hold off on any steroids at this time.     Tricuspid regurgitation   SIRS (systemic inflammatory response syndrome)   Weakness generalized-  Ck orthostatics in a day or so after ivf,  Pt eval prior to d/c.   Hypoxia   Discussed code status.  She is unsure but leaning toward DNR but unsure.  Will need further discussion.  FULL code for now.  Admit to medical.  DAVID,RACHAL A 05/29/2014, 12:01 AM

## 2014-05-30 ENCOUNTER — Inpatient Hospital Stay (HOSPITAL_COMMUNITY): Payer: PRIVATE HEALTH INSURANCE

## 2014-05-30 DIAGNOSIS — J9601 Acute respiratory failure with hypoxia: Secondary | ICD-10-CM

## 2014-05-30 DIAGNOSIS — D649 Anemia, unspecified: Secondary | ICD-10-CM

## 2014-05-30 DIAGNOSIS — G9340 Encephalopathy, unspecified: Secondary | ICD-10-CM

## 2014-05-30 LAB — GLUCOSE, CAPILLARY
GLUCOSE-CAPILLARY: 146 mg/dL — AB (ref 70–99)
GLUCOSE-CAPILLARY: 156 mg/dL — AB (ref 70–99)
Glucose-Capillary: 161 mg/dL — ABNORMAL HIGH (ref 70–99)
Glucose-Capillary: 155 mg/dL — ABNORMAL HIGH (ref 70–99)

## 2014-05-30 LAB — BLOOD GAS, ARTERIAL
Acid-base deficit: 4.8 mmol/L — ABNORMAL HIGH (ref 0.0–2.0)
BICARBONATE: 20 meq/L (ref 20.0–24.0)
FIO2: 40 %
O2 Saturation: 68.4 %
PATIENT TEMPERATURE: 37
PCO2 ART: 37.9 mmHg (ref 35.0–45.0)
TCO2: 17.8 mmol/L (ref 0–100)
pH, Arterial: 7.342 — ABNORMAL LOW (ref 7.350–7.450)
pO2, Arterial: 41.2 mmHg — ABNORMAL LOW (ref 80.0–100.0)

## 2014-05-30 LAB — LEGIONELLA ANTIGEN, URINE

## 2014-05-30 LAB — MRSA PCR SCREENING: MRSA BY PCR: NEGATIVE

## 2014-05-30 LAB — STREP PNEUMONIAE URINARY ANTIGEN: STREP PNEUMO URINARY ANTIGEN: NEGATIVE

## 2014-05-30 MED ORDER — IPRATROPIUM-ALBUTEROL 0.5-2.5 (3) MG/3ML IN SOLN
3.0000 mL | RESPIRATORY_TRACT | Status: DC
  Administered 2014-05-30 – 2014-06-01 (×11): 3 mL via RESPIRATORY_TRACT
  Filled 2014-05-30 (×11): qty 3

## 2014-05-30 MED ORDER — VANCOMYCIN HCL IN DEXTROSE 1-5 GM/200ML-% IV SOLN
1000.0000 mg | Freq: Once | INTRAVENOUS | Status: AC
  Administered 2014-05-30: 1000 mg via INTRAVENOUS
  Filled 2014-05-30: qty 200

## 2014-05-30 MED ORDER — INSULIN ASPART 100 UNIT/ML ~~LOC~~ SOLN
0.0000 [IU] | Freq: Four times a day (QID) | SUBCUTANEOUS | Status: DC
Start: 2014-05-30 — End: 2014-06-01
  Administered 2014-05-30: 2 [IU] via SUBCUTANEOUS
  Administered 2014-05-31: 1 [IU] via SUBCUTANEOUS
  Administered 2014-05-31: 3 [IU] via SUBCUTANEOUS

## 2014-05-30 MED ORDER — LORAZEPAM 2 MG/ML IJ SOLN
0.5000 mg | Freq: Four times a day (QID) | INTRAMUSCULAR | Status: DC | PRN
  Administered 2014-05-30 – 2014-06-01 (×3): 0.5 mg via INTRAVENOUS
  Filled 2014-05-30 (×3): qty 1

## 2014-05-30 MED ORDER — ENOXAPARIN SODIUM 40 MG/0.4ML ~~LOC~~ SOLN
40.0000 mg | SUBCUTANEOUS | Status: DC
  Administered 2014-05-31: 40 mg via SUBCUTANEOUS
  Filled 2014-05-30: qty 0.4

## 2014-05-30 MED ORDER — FUROSEMIDE 10 MG/ML IJ SOLN
40.0000 mg | Freq: Once | INTRAMUSCULAR | Status: AC
  Administered 2014-05-30: 40 mg via INTRAVENOUS
  Filled 2014-05-30: qty 4

## 2014-05-30 MED ORDER — MORPHINE SULFATE 2 MG/ML IJ SOLN
2.0000 mg | INTRAMUSCULAR | Status: DC | PRN
  Administered 2014-05-30: 2 mg via INTRAVENOUS
  Filled 2014-05-30: qty 1

## 2014-05-30 MED ORDER — ALBUTEROL SULFATE (2.5 MG/3ML) 0.083% IN NEBU
2.5000 mg | INHALATION_SOLUTION | RESPIRATORY_TRACT | Status: DC | PRN
  Administered 2014-05-30: 2.5 mg via RESPIRATORY_TRACT
  Filled 2014-05-30: qty 3

## 2014-05-30 MED ORDER — VANCOMYCIN HCL IN DEXTROSE 750-5 MG/150ML-% IV SOLN
750.0000 mg | INTRAVENOUS | Status: DC
  Administered 2014-05-31: 750 mg via INTRAVENOUS
  Filled 2014-05-30 (×4): qty 150

## 2014-05-30 NOTE — Progress Notes (Signed)
Patient being transfer to ICU bed 9,report called,and given to [facility]. No c/o pain or discomfort noted. Patient transported with oxygen,100 percent nonrebreather,staff RN accompanied patient to awaiting floor.

## 2014-05-30 NOTE — Progress Notes (Signed)
Patient's saturation at 76 to 55,with heart rate 120-130's,Dr Sarajane Jews notified,orders received,and given. Respiratory also notified,will continue to monitor patient.Patient placed on 100 percent nonrebreather by Respiratory therapist Cleophas Dunker at bedside.No c/o pain or discomfort.

## 2014-05-30 NOTE — Progress Notes (Signed)
ANTIBIOTIC CONSULT NOTE - INITIAL  Pharmacy Consult for Vancomycin Indication: pneumonia  Allergies  Allergen Reactions  . Ace Inhibitors Cough  . Bactrim [Sulfamethoxazole-Trimethoprim] Nausea Only  . Lipitor [Atorvastatin]     myalgia    Patient Measurements: Height: 5\' 4"  (162.6 cm) Weight: 118 lb 13.3 oz (53.9 kg) IBW/kg (Calculated) : 54.7 Adjusted Body Weight:   Vital Signs: Temp: 98.9 F (37.2 C) (11/11 0543) Temp Source: Oral (11/11 0543) BP: 154/44 mmHg (11/11 1058) Pulse Rate: 105 (11/11 1058) Intake/Output from previous day: 11/10 0701 - 11/11 0700 In: 800 [P.O.:500; IV Piggyback:300] Out: 900 [Urine:900] Intake/Output from this shift:    Labs:  Recent Labs  05/28/14 1910 05/29/14 0805  WBC 13.5* 12.5*  HGB 10.7* 10.3*  PLT 165 189  CREATININE 1.09 0.86   Estimated Creatinine Clearance: 37.7 mL/min (by C-G formula based on Cr of 0.86). No results for input(s): VANCOTROUGH, VANCOPEAK, VANCORANDOM, GENTTROUGH, GENTPEAK, GENTRANDOM, TOBRATROUGH, TOBRAPEAK, TOBRARND, AMIKACINPEAK, AMIKACINTROU, AMIKACIN in the last 72 hours.   Microbiology: Recent Results (from the past 720 hour(s))  Blood Culture (routine x 2)     Status: None (Preliminary result)   Collection Time: 05/28/14 11:05 PM  Result Value Ref Range Status   Specimen Description BLOOD LEFT ARM  Final   Special Requests BOTTLES DRAWN AEROBIC AND ANAEROBIC [place]  Final   Culture PENDING  Incomplete   Report Status PENDING  Incomplete  Blood Culture (routine x 2)     Status: None (Preliminary result)   Collection Time: 05/28/14 11:13 PM  Result Value Ref Range Status   Specimen Description BLOOD RIGHT HAND  Final   Special Requests   Final    BOTTLES DRAWN AEROBIC AND ANAEROBIC AEB 10CC ANA [place]   Culture PENDING  Incomplete   Report Status PENDING  Incomplete  Culture, blood (routine x 2) Call MD if unable to obtain prior to antibiotics being given     Status: None (Preliminary result)   Collection Time: 05/29/14  8:03 AM  Result Value Ref Range Status   Specimen Description Blood RIGHT ARM  Final   Special Requests   Final    BOTTLES DRAWN AEROBIC AND ANAEROBIC 6 CC EACH BOTTLE   Culture PENDING  Incomplete   Report Status PENDING  Incomplete  Culture, blood (routine x 2) Call MD if unable to obtain prior to antibiotics being given     Status: None (Preliminary result)   Collection Time: 05/29/14  8:03 AM  Result Value Ref Range Status   Specimen Description Blood LEFT ARM  Final   Special Requests   Final    BOTTLES DRAWN AEROBIC AND ANAEROBIC 6 CC EACH BOTTLE   Culture PENDING  Incomplete   Report Status PENDING  Incomplete    Medical History: Past Medical History  Diagnosis Date  . Pulmonary fibrosis     seen on X-ray   . Hip fracture, left 2010  . Anxiety   . Uterine cancer 1976  . Diabetes mellitus   . HLD (hyperlipidemia)   . HTN (hypertension)   . Osteoporosis   . GERD (gastroesophageal reflux disease)   . Gout   . Glaucoma   . Tricuspid regurgitation   . Colon polyps   . Stroke 2012    Medications:  Scheduled:  . allopurinol  200 mg Oral Daily  . azithromycin  500 mg Intravenous Q24H  . cefTRIAXone (ROCEPHIN)  IV  1 g Intravenous Q24H  . cholecalciferol  1,000 Units Oral Daily  .  citalopram  20 mg Oral Daily  . [START ON 05/31/2014] enoxaparin (LOVENOX) injection  40 mg Subcutaneous Q24H  . felodipine  10 mg Oral Daily  . insulin aspart  0-9 Units Subcutaneous Q6H  . vancomycin  1,000 mg Intravenous Once  . [START ON 05/31/2014] vancomycin  750 mg Intravenous Q24H   Assessment: CAP with sepsis and hypotension on admission Empiric antibiotics  Goal of Therapy:  Vancomycin trough level 15-20 mcg/ml  Plan:  Vancomycin 1 GM IV loading dose, then 750 mg IV every 24 hours Vancomycin trough at steady state Monitor renal function Labs per protocol  Abner Greenspan, Grace Haggart Bennett 05/30/2014,2:27 PM

## 2014-05-30 NOTE — Consult Note (Signed)
Full note to follow. She is on BIPAP and very agitated. Discussed with POA and she is considering DNR. Continue current treatment.

## 2014-05-30 NOTE — Progress Notes (Signed)
Patient's oxygen saturation 71 percent on 100 percent nonrebreather,Dr Sarajane Jews notified,ABG results also given to MD.Orders received,and given,transfer patient to ICU for Bipap. Will continue to monitor.

## 2014-05-30 NOTE — Progress Notes (Signed)
PT Cancellation Note  Patient Details Name: Mary Church MRN: [id number] DOB: 31-Jan-1923   Cancelled Treatment:    Reason Eval/Treat Not Completed: Other (comment)  Received order and chart reviewed.  Attempted PT evaluation, however pt appears agitated (continuous movement in bed) and has on hand mitts (per RN, pt was pulling at lines over night).  When attempting hx questions, pt unable to answer appropriately secondary to confusion; pt able to state name and DOB, though states she is at her neighbors house currently and the year is 50.  Will hold on PT evaluation until pt is able to safety participate.     Daivik Overley 05/30/2014, 10:44 AM

## 2014-05-30 NOTE — Progress Notes (Signed)
Called to room by RN, pt's oxygen was dropping.  When checked her oxygen was 50% on nasal can., started neb tx immediately and then placed patient on NRB.  MD ordered ABG, saturation according to gas was in the 60's, PaO2 in the 40's.  Pt was placed on NRB and 6 LPM nasal can. Patient is transferring to the ICU for bipap.  Will continue to monitor.

## 2014-05-30 NOTE — Progress Notes (Signed)
Patient's room air oxygen saturation was 86 percent, oxygen was applied via nasal canula at 2 liters. Will continue to monitor patient.

## 2014-05-30 NOTE — Progress Notes (Signed)
Patient has inspiratory,and expiratory wheezing noted,Dr Sarajane Jews notified,will continue to monitor patient.

## 2014-05-30 NOTE — Progress Notes (Addendum)
Called to see patient for profound hypoxia approximately 2 PM.  Patient awake, alert, somewhat confused but followed simple commands. Able to speak in full sentences, no apparent distress.  On exam appears calm, comfortable. Cardiovascular tachycardic. Diffuse expiratory coarse breath sounds bilaterally without frank wheezes or rales. Mild to moderate increased respiratory effort.  Hypoxia noted in the 50-60s, placed on facemask.  Stat chest x-ray independently reviewed showed a significantly worse bilateral pneumonia especially in the right mid to upper lung field and left lower lung fields, concordant with radiology interpretation.  ABG 7.342, 37, 41  A/P CAP with sepsis, now with profound hypoxic respiratory failure with substantially worse infiltrates on chest x-ray.  Plan transfer to stepdown unit, start BiPAP, add vancomycin. Will consult pulmonology.  Patient is critically ill with guarded prognosis.  Discussed in detail with healthcare power of attorney by telephone Pricilla Loveless (grand-niece). She is aware declined in critical illness and guarded prognosis. We discussed current plan of care. She reports intubation would be desired if necessary as would defibrillation/cardioversion, medications. However under no circumstances should the patient have chest compressions performed. In the event of cardiac arrest with nonshockable rhythm, healthcare power of attorney requests no further attempts be made.  Murray Hodgkins, MD [facility] [phone]  Addendum 1900 oxygneation has stablizied on BIPAP. Discussed with Dr. Luan Pulling. Discussed with Jessica Priest at bedside. She has conferred with family and discussed with Dr. Luan Pulling. She wants to continue with current plan of abx and BiPAP but does not want intubation, shock or ACLS. She requests full DNR/DNI status. I concur. Conversation witnessed by [employer] Hardin Negus.

## 2014-05-30 NOTE — Progress Notes (Signed)
PROGRESS NOTE  Mary Church ZRA:[id number] DOB: 16-May-1923 DOA: 05/28/2014 PCP: Redge Gainer, MD  Summary: 53yow presented with h/o generalized weakness, cough; became so weak waling at home almost passed out. No syncope. Was able to lie down, called EMS, SBP low 90s. Admitted for CAP.   Assessment/Plan: 1. CAP with sepsis and hypotensionon admission. Hemodynamics improved, hypotension appears to have resolved. Low-grade temperature. Mild hypoxia. 2. Acute hypoxic respiratory failure.secondary to pneumonia. 3. Acute encephalopathy. Mitts placed overnight. Likely secondary to acute illness. 4. Normocytic anemia, likely at baseline.  5. Generalized weakness, plan physical therapy consult. 6. Pulmonary fibrosis.  7. DM type 2. Capillary blood sugar stable. 8. Anxiety  9. RUL lung nodule. F/u as outpatient.    Appears to be stabilizing in regard to pneumonia, sepsis. Plan to continue empiric antibiotics. Wean oxygen as tolerated.  Fall precautions. Sitter if becomes more confused. Expect encephalopathy to improve his general condition improves.  Lung exam probably reflective of pulmonary fibrosis. No evidence of respiratory distress. Trial bronchodilators. Saline lock fluids.  Sputum culture  Code Status: full code DVT prophylaxis: Lovenox Family Communication: none present Disposition Plan: pending, likely home  Murray Hodgkins, MD  [facility]  Pager [phone] If 7PM-7AM, please contact night-coverage at [email or web address], password [facility] 05/30/2014, 11:16 AM  LOS: 2 days   Consultants:    Procedures:    Antibiotics:  Azithromycin 11/10 >>  Ceftriaxone 11/10 >>  HPI/Subjective: Refusing meds and food. Confused overnight, mitts placed. Given home Xanax last night.  Patient confused, history unreliable.  Objective: Filed Vitals:   05/29/14 1453 05/29/14 2149 05/30/14 0543 05/30/14 1058  BP: 110/39 113/61 98/62 154/44  Pulse: 67 66 64 105  Temp: 100.4 F  (38 C) 99.1 F (37.3 C) 98.9 F (37.2 C)   TempSrc: Oral Oral Oral   Resp: 18 18 20    Height:      Weight:      SpO2: 91% 92% 93% 92%    Intake/Output Summary (Last 24 hours) at 05/30/14 1116 Last data filed at 05/30/14 0156  Gross per 24 hour  Intake    500 ml  Output    600 ml  Net   -100 ml     Filed Weights   05/28/14 1838 05/28/14 2355  Weight: 51.71 kg (114 lb) 53.9 kg (118 lb 13.3 oz)    Exam:     Afebrile, Tm 100.4, vital signs stable. Mild hypoxia, 93% on 2 L.  General: appears mildly anxious, comfortable.  Psych: alert. Speech fluent, not entirely appropriate. Disoriented to month, year. Oriented to name and location.  Eyes: appears grossly unremarkable  CV: regular rate and rhythm. No murmur, rub or gallop.  Respiratory: diffuse expiratory wheezes bilaterally. No rhonchi or wheezes. Normal respiratory effort. Speaks in full sentences.  Abdomen: soft, nontender, nondistended  Skin:appears grossly unremarkable  Musculoskeletal: grossly unremarkable  Neuro: grossly nonfocal  Data Reviewed:  UOP 900, BM x4  CBG stable  Hgb stable 10.3  K+ 3.6  Scheduled Meds: . allopurinol  200 mg Oral Daily  . azithromycin  500 mg Intravenous Q24H  . cefTRIAXone (ROCEPHIN)  IV  1 g Intravenous Q24H  . cholecalciferol  1,000 Units Oral Daily  . citalopram  20 mg Oral Daily  . enoxaparin (LOVENOX) injection  30 mg Subcutaneous Q24H  . felodipine  10 mg Oral Daily  . insulin aspart  0-9 Units Subcutaneous TID WC   Continuous Infusions:   Principal Problem:   CAP (community acquired pneumonia) Active Problems:  Anxiety   Diabetes mellitus type 2, controlled, without complications   HTN (hypertension)   Pulmonary fibrosis   Tricuspid regurgitation   SIRS (systemic inflammatory response syndrome)   Weakness generalized   Hypoxia   Sepsis   Time spent 25 minutes

## 2014-05-31 ENCOUNTER — Inpatient Hospital Stay (HOSPITAL_COMMUNITY): Payer: PRIVATE HEALTH INSURANCE

## 2014-05-31 LAB — BASIC METABOLIC PANEL
ANION GAP: 16 — AB (ref 5–15)
BUN: 35 mg/dL — ABNORMAL HIGH (ref 6–23)
CO2: 24 mEq/L (ref 19–32)
Calcium: 9 mg/dL (ref 8.4–10.5)
Chloride: 99 mEq/L (ref 96–112)
Creatinine, Ser: 1.03 mg/dL (ref 0.50–1.10)
GFR calc non Af Amer: 47 mL/min — ABNORMAL LOW (ref >90)
GFR, EST AFRICAN AMERICAN: 54 mL/min — AB (ref >90)
Glucose, Bld: 131 mg/dL — ABNORMAL HIGH (ref 70–99)
Potassium: 3.4 mEq/L — ABNORMAL LOW (ref 3.7–5.3)
Sodium: 139 mEq/L (ref 137–147)

## 2014-05-31 LAB — BLOOD GAS, ARTERIAL
Acid-Base Excess: 1.8 mmol/L (ref 0.0–2.0)
BICARBONATE: 21.9 meq/L (ref 20.0–24.0)
DRAWN BY: 21310
Delivery systems: POSITIVE
Expiratory PAP: 7
FIO2: 70 %
Inspiratory PAP: 14
O2 Saturation: 88.7 %
PCO2 ART: 33.1 mmHg — AB (ref 35.0–45.0)
TCO2: 18.3 mmol/L (ref 0–100)
pH, Arterial: 7.435 (ref 7.350–7.450)
pO2, Arterial: 57.8 mmHg — ABNORMAL LOW (ref 80.0–100.0)

## 2014-05-31 LAB — GLUCOSE, CAPILLARY
GLUCOSE-CAPILLARY: 109 mg/dL — AB (ref 70–99)
GLUCOSE-CAPILLARY: 133 mg/dL — AB (ref 70–99)
Glucose-Capillary: 119 mg/dL — ABNORMAL HIGH (ref 70–99)
Glucose-Capillary: 222 mg/dL — ABNORMAL HIGH (ref 70–99)

## 2014-05-31 LAB — CBC
HCT: 26.8 % — ABNORMAL LOW (ref 36.0–46.0)
Hemoglobin: 9.4 g/dL — ABNORMAL LOW (ref 12.0–15.0)
MCH: 31.1 pg (ref 26.0–34.0)
MCHC: 35.1 g/dL (ref 30.0–36.0)
MCV: 88.7 fL (ref 78.0–100.0)
PLATELETS: 277 10*3/uL (ref 150–400)
RBC: 3.02 MIL/uL — ABNORMAL LOW (ref 3.87–5.11)
RDW: 13.4 % (ref 11.5–15.5)
WBC: 15 10*3/uL — ABNORMAL HIGH (ref 4.0–10.5)

## 2014-05-31 MED ORDER — CETYLPYRIDINIUM CHLORIDE 0.05 % MT LIQD
7.0000 mL | Freq: Two times a day (BID) | OROMUCOSAL | Status: DC
  Administered 2014-05-31 (×3): 7 mL via OROMUCOSAL

## 2014-05-31 MED ORDER — CHLORHEXIDINE GLUCONATE 0.12 % MT SOLN
15.0000 mL | Freq: Two times a day (BID) | OROMUCOSAL | Status: DC
  Administered 2014-05-31 – 2014-06-04 (×6): 15 mL via OROMUCOSAL
  Filled 2014-05-31 (×5): qty 15

## 2014-05-31 MED ORDER — INFLUENZA VAC SPLIT QUAD 0.5 ML IM SUSY
0.5000 mL | PREFILLED_SYRINGE | INTRAMUSCULAR | Status: DC
  Filled 2014-05-31: qty 0.5

## 2014-05-31 MED ORDER — SODIUM CHLORIDE 0.9 % IV SOLN
INTRAVENOUS | Status: DC
  Administered 2014-05-31: 200 mL via INTRAVENOUS
  Administered 2014-06-01: 1000 mL via INTRAVENOUS
  Administered 2014-06-01: 10:00:00 via INTRAVENOUS

## 2014-05-31 MED ORDER — METOPROLOL TARTRATE 1 MG/ML IV SOLN
5.0000 mg | Freq: Once | INTRAVENOUS | Status: AC
  Administered 2014-05-31: 5 mg via INTRAVENOUS
  Filled 2014-05-31: qty 5

## 2014-05-31 NOTE — Progress Notes (Signed)
PT Cancellation Note  Patient Details Name: Mary Church MRN: [id number] DOB: 12-Oct-1922   Cancelled Treatment:    Reason Eval/Treat Not Completed: Medical issues which prohibited therapy  Reviewed chart and noted pt transferred to ICU.  Will hold PT evaluation at this time until pt is medically stable.   Will need new PT order for PT evaluation when pt able to actively participate in PT evaluation.    Nevaeh Korte 05/31/2014, 9:12 AM

## 2014-05-31 NOTE — Progress Notes (Addendum)
Subjective: She is significantly calmer this morning. She looks more comfortable. She is still on BiPAP.  Objective: Vital signs in last 24 hours: Temp:  [97.1 F (36.2 C)-98.4 F (36.9 C)] 98.4 F (36.9 C) (11/12 0747) Pulse Rate:  [56-150] 69 (11/12 0504) Resp:  [17-32] 21 (11/12 0504) BP: (89-154)/(34-96) 140/37 mmHg (11/12 0500) SpO2:  [76 %-100 %] 91 % (11/12 0504) FiO2 (%):  [60 %-100 %] 60 % (11/12 0401) Weight:  [55.2 kg (121 lb 11.1 oz)] 55.2 kg (121 lb 11.1 oz) (11/12 0500) Weight change:  Last BM Date: 05/29/14  Intake/Output from previous day: 11/11 0701 - 11/12 0700 In: 500 [IV Piggyback:500] Out: 425 [Urine:425]  PHYSICAL EXAM General appearance: alert, mild distress and generally much more comfortable Resp: rhonchi bilaterally Cardio: regular rate and rhythm, S1, S2 normal, no murmur, click, rub or gallop GI: soft, non-tender; bowel sounds normal; no masses,  no organomegaly Extremities: extremities normal, atraumatic, no cyanosis or edema  Lab Results:  Results for orders placed or performed during the hospital encounter of 05/28/14 (from the past 48 hour(s))  Glucose, capillary     Status: Abnormal   Collection Time: 05/29/14 11:26 AM  Result Value Ref Range   Glucose-Capillary 198 (H) 70 - 99 mg/dL  Glucose, capillary     Status: Abnormal   Collection Time: 05/29/14  4:36 PM  Result Value Ref Range   Glucose-Capillary 147 (H) 70 - 99 mg/dL  Legionella antigen, urine     Status: None   Collection Time: 05/29/14  4:41 PM  Result Value Ref Range   Specimen Description URINE, RANDOM    Special Requests NONE    Legionella Antigen, Urine      Negative for Legionella pneumophila serogroup 1                                                              Legionella pneumophila serogroup 1 antigen can be detected in urine within 2 to 3 days of infection and may persist even after treatment. This  assay does not detect other Legionella species or  serogroups. Performed at [facility]    Report Status 05/30/2014 FINAL   Strep pneumoniae urinary antigen     Status: None   Collection Time: 05/29/14  4:41 PM  Result Value Ref Range   Strep Pneumo Urinary Antigen NEGATIVE NEGATIVE    Comment: Performed at [facility]        Infection due to S. pneumoniae cannot be absolutely ruled out since the antigen present may be below the detection limit of the test. Performed at [facility]   Glucose, capillary     Status: Abnormal   Collection Time: 05/29/14  9:48 PM  Result Value Ref Range   Glucose-Capillary 143 (H) 70 - 99 mg/dL   Comment 1 Notify RN   Glucose, capillary     Status: Abnormal   Collection Time: 05/30/14  7:29 AM  Result Value Ref Range   Glucose-Capillary 161 (H) 70 - 99 mg/dL   Comment 1 Notify RN    Comment 2 Documented in Chart   Glucose, capillary     Status: Abnormal   Collection Time: 05/30/14 11:44 AM  Result Value Ref Range   Glucose-Capillary 155 (H) 70 - 99 mg/dL  Comment 1 Notify RN    Comment 2 Documented in Chart   Blood gas, arterial     Status: Abnormal   Collection Time: 05/30/14  2:00 PM  Result Value Ref Range   FIO2 40.00 %   Delivery systems OXYGEN MASK    pH, Arterial 7.342 (L) 7.350 - 7.450   pCO2 arterial 37.9 35.0 - 45.0 mmHg   pO2, Arterial 41.2 (L) 80.0 - 100.0 mmHg   Bicarbonate 20.0 20.0 - 24.0 mEq/L   TCO2 17.8 0 - 100 mmol/L   Acid-base deficit 4.8 (H) 0.0 - 2.0 mmol/L   O2 Saturation 68.4 %   Patient temperature 37.0    Collection site RIGHT BRACHIAL    Drawn by COLLECTED BY RT    Sample type ARTERIAL    Allens test (pass/fail) NOT INDICATED (A) PASS  MRSA PCR Screening     Status: None   Collection Time: 05/30/14  3:44 PM  Result Value Ref Range   MRSA by PCR NEGATIVE NEGATIVE    Comment:        The GeneXpert MRSA Assay (FDA approved for NASAL specimens only), is one component of a comprehensive MRSA colonization surveillance program. It  is not intended to diagnose MRSA infection nor to guide or monitor treatment for MRSA infections.   Glucose, capillary     Status: Abnormal   Collection Time: 05/30/14  5:13 PM  Result Value Ref Range   Glucose-Capillary 146 (H) 70 - 99 mg/dL   Comment 1 Documented in Chart    Comment 2 Notify RN   Glucose, capillary     Status: Abnormal   Collection Time: 05/30/14  6:49 PM  Result Value Ref Range   Glucose-Capillary 156 (H) 70 - 99 mg/dL   Comment 1 Documented in Chart    Comment 2 Notify RN   Glucose, capillary     Status: Abnormal   Collection Time: 05/31/14 12:24 AM  Result Value Ref Range   Glucose-Capillary 222 (H) 70 - 99 mg/dL  Blood gas, arterial     Status: Abnormal   Collection Time: 05/31/14  4:30 AM  Result Value Ref Range   FIO2 70.00 %   Delivery systems BILEVEL POSITIVE AIRWAY PRESSURE    Inspiratory PAP 14.0    Expiratory PAP 7.0    pH, Arterial 7.435 7.350 - 7.450   pCO2 arterial 33.1 (L) 35.0 - 45.0 mmHg   pO2, Arterial 57.8 (L) 80.0 - 100.0 mmHg   Bicarbonate 21.9 20.0 - 24.0 mEq/L   TCO2 18.3 0 - 100 mmol/L   Acid-Base Excess 1.8 0.0 - 2.0 mmol/L   O2 Saturation 88.7 %   Collection site RIGHT RADIAL    Drawn by [id number]    Sample type ARTERIAL    Allens test (pass/fail) PASS PASS  Basic metabolic panel     Status: Abnormal   Collection Time: 05/31/14  5:53 AM  Result Value Ref Range   Sodium 139 137 - 147 mEq/L   Potassium 3.4 (L) 3.7 - 5.3 mEq/L   Chloride 99 96 - 112 mEq/L   CO2 24 19 - 32 mEq/L   Glucose, Bld 131 (H) 70 - 99 mg/dL   BUN 35 (H) 6 - 23 mg/dL   Creatinine, Ser 7.14 0.50 - 1.10 mg/dL   Calcium 9.0 8.4 - 10.6 mg/dL   GFR calc non Af Amer 47 (L) >90 mL/min   GFR calc Af Amer 54 (L) >90 mL/min    Comment: (NOTE) The eGFR  has been calculated using the CKD EPI equation. This calculation has not been validated in all clinical situations. eGFR's persistently <90 mL/min signify possible Chronic Kidney Disease.    Anion gap 16 (H) 5  - 15  CBC     Status: Abnormal   Collection Time: 05/31/14  5:53 AM  Result Value Ref Range   WBC 15.0 (H) 4.0 - 10.5 K/uL   RBC 3.02 (L) 3.87 - 5.11 MIL/uL   Hemoglobin 9.4 (L) 12.0 - 15.0 g/dL   HCT 26.8 (L) 36.0 - 46.0 %   MCV 88.7 78.0 - 100.0 fL   MCH 31.1 26.0 - 34.0 pg   MCHC 35.1 30.0 - 36.0 g/dL   RDW 13.4 11.5 - 15.5 %   Platelets 277 150 - 400 K/uL    Comment: DELTA CHECK NOTED  Glucose, capillary     Status: Abnormal   Collection Time: 05/31/14  7:29 AM  Result Value Ref Range   Glucose-Capillary 109 (H) 70 - 99 mg/dL   Comment 1 Documented in Chart    Comment 2 Notify RN     ABGS  Recent Labs  05/31/14 0430  PHART 7.435  PO2ART 57.8*  TCO2 18.3  HCO3 21.9   CULTURES Recent Results (from the past 240 hour(s))  Blood Culture (routine x 2)     Status: None (Preliminary result)   Collection Time: 05/28/14 11:05 PM  Result Value Ref Range Status   Specimen Description BLOOD LEFT ARM  Final   Special Requests BOTTLES DRAWN AEROBIC AND ANAEROBIC [facility] EACH  Final   Culture PENDING  Incomplete   Report Status PENDING  Incomplete  Blood Culture (routine x 2)     Status: None (Preliminary result)   Collection Time: 05/28/14 11:13 PM  Result Value Ref Range Status   Specimen Description BLOOD RIGHT HAND  Final   Special Requests   Final    BOTTLES DRAWN AEROBIC AND ANAEROBIC AEB 10CC ANA [place]   Culture PENDING  Incomplete   Report Status PENDING  Incomplete  Culture, blood (routine x 2) Call MD if unable to obtain prior to antibiotics being given     Status: None (Preliminary result)   Collection Time: 05/29/14  8:03 AM  Result Value Ref Range Status   Specimen Description Blood RIGHT ARM  Final   Special Requests   Final    BOTTLES DRAWN AEROBIC AND ANAEROBIC 6 CC EACH BOTTLE   Culture PENDING  Incomplete   Report Status PENDING  Incomplete  Culture, blood (routine x 2) Call MD if unable to obtain prior to antibiotics being given     Status: None (Preliminary  result)   Collection Time: 05/29/14  8:03 AM  Result Value Ref Range Status   Specimen Description Blood LEFT ARM  Final   Special Requests   Final    BOTTLES DRAWN AEROBIC AND ANAEROBIC 6 CC EACH BOTTLE   Culture PENDING  Incomplete   Report Status PENDING  Incomplete  MRSA PCR Screening     Status: None   Collection Time: 05/30/14  3:44 PM  Result Value Ref Range Status   MRSA by PCR NEGATIVE NEGATIVE Final    Comment:        The GeneXpert MRSA Assay (FDA approved for NASAL specimens only), is one component of a comprehensive MRSA colonization surveillance program. It is not intended to diagnose MRSA infection nor to guide or monitor treatment for MRSA infections.    Studies/Results: Dg Chest Port 1 [address]  05/31/2014   CLINICAL DATA:  Respiratory failure  EXAM: PORTABLE CHEST - 1 VIEW  COMPARISON:  Portable chest x-ray of May 30, 2014 and May 28, 2014  FINDINGS: The lungs are well-expanded. Confluent alveolar infiltrates are present bilaterally and are little changed since yesterday's study but are considerably more pronounced than on the earlier exam. The cardiac silhouette is normal in size. The pulmonary vascularity is indistinct but not clearly engorged. There is no pleural effusion. The trachea is midline. The bony thorax exhibits no acute abnormality.  IMPRESSION: Confluent alveolar opacities are consistent with bilateral pneumonia or other alveolar filling processes including noncardiac pulmonary edema. There has been little change since yesterday's study.   Electronically Signed   By: David  Martinique   On: 05/31/2014 07:36   Dg Chest Port 1 View  05/30/2014   CLINICAL DATA:  Hypoxia. Shortness of breath. Pulmonary fibrosis. Diabetes.  EXAM: PORTABLE CHEST - 1 VIEW  COMPARISON:  05/28/2014  FINDINGS: Severe progression of bilateral airspace opacities noted, with extensive new left perihilar and basilar airspace opacities, increased in the right upper lobe opacity, and  increase in the right perihilar and basilar airspace opacity. Heart size remains within normal limits.  Atherosclerotic aortic arch.  IMPRESSION: 1. Worsened considerable bilateral airspace opacities suggesting acute bilateral pneumonia, noncardiogenic edema, or pulmonary hemorrhage.   Electronically Signed   By: Sherryl Barters M.D.   On: 05/30/2014 14:29    Medications:  Prior to Admission:  Prescriptions prior to admission  Medication Sig Dispense Refill Last Dose  . allopurinol (ZYLOPRIM) 100 MG tablet Take 2 tablets (200 mg total) by mouth daily. 60 tablet 11 05/28/2014 at Unknown time  . ALPRAZolam (XANAX) 0.5 MG tablet Take 0.5 mg by mouth 3 (three) times daily as needed for anxiety.   05/28/2014 at Unknown time  . cholecalciferol (VITAMIN D) 1000 UNITS tablet Take 1,000 Units by mouth daily.   05/27/2014 at Unknown time  . citalopram (CELEXA) 20 MG tablet Take 1 tablet (20 mg total) by mouth daily. 30 tablet 3 05/28/2014 at Unknown time  . felodipine (PLENDIL) 10 MG 24 hr tablet Take 1 tablet (10 mg total) by mouth daily. 30 tablet 11 05/28/2014 at Unknown time  . lisinopril-hydrochlorothiazide (PRINZIDE,ZESTORETIC) 20-25 MG per tablet Take 1 tablet by mouth daily. 30 tablet 11 05/28/2014 at Unknown time  . metFORMIN (GLUCOPHAGE) 500 MG tablet Take 500 mg by mouth every morning.   05/28/2014 at Unknown time  . methylcellulose (ARTIFICIAL TEARS) 1 % ophthalmic solution Place 1 drop into both eyes daily.   05/28/2014 at Unknown time  . Multiple Vitamin (MULTIVITAMIN) tablet Take 1 tablet by mouth daily.   05/28/2014 at Unknown time  . rosuvastatin (CRESTOR) 10 MG tablet Take 1 tablet (10 mg total) by mouth daily. (Patient taking differently: Take 10 mg by mouth every other day. ) 30 tablet 11 05/26/2014 at Unknown time  . ALPRAZolam (XANAX) 0.5 MG tablet TAKE  (1)  TABLET  THREE TIMES DAILY. (Patient not taking: Reported on 05/28/2014) 90 tablet 1   . glucose blood (TRUETRACK TEST) test strip Test  blood sugar once a day, Dx 250.00 (Patient not taking: Reported on 05/28/2014) 100 each 2 Taking  . metFORMIN (GLUCOPHAGE) 500 MG tablet TAKE 1 TABLET IN MORNING (Patient not taking: Reported on 05/28/2014) 30 tablet 11    Scheduled: . allopurinol  200 mg Oral Daily  . antiseptic oral rinse  7 mL Mouth Rinse q12n4p  . azithromycin  500 mg Intravenous Q24H  .  cefTRIAXone (ROCEPHIN)  IV  1 g Intravenous Q24H  . chlorhexidine  15 mL Mouth Rinse BID  . cholecalciferol  1,000 Units Oral Daily  . citalopram  20 mg Oral Daily  . enoxaparin (LOVENOX) injection  40 mg Subcutaneous Q24H  . felodipine  10 mg Oral Daily  . insulin aspart  0-9 Units Subcutaneous Q6H  . ipratropium-albuterol  3 mL Nebulization Q4H  . vancomycin  750 mg Intravenous Q24H   Continuous:  IHW:[id number], LORazepam, morphine injection  Assesment:she was admitted with community-acquired pneumonia. At baseline she has pulmonary fibrosis and generalized weakness. She appears to be septic and overall is better than when I saw her last night. She did not tolerate coming off BiPAP during the night but she is much more comfortable. Her chest x-ray is basically unchanged. Arterial blood gas shows relative hypoxia Principal Problem:   CAP (community acquired pneumonia) Active Problems:   Anxiety   Diabetes mellitus type 2, controlled, without complications   HTN (hypertension)   Pulmonary fibrosis   Tricuspid regurgitation   SIRS (systemic inflammatory response syndrome)   Weakness generalized   Hypoxia   Sepsis    Plan:continue current treatments.I agree with plans for DO NOT RESUSCITATE status    LOS: 3 days   Primitivo Merkey L 05/31/2014, 8:09 AM

## 2014-05-31 NOTE — Progress Notes (Signed)
eLink Physician-Brief Progress Note Patient Name: Mary Church DOB: 1922/09/14 MRN: [id number]   Date of Service  05/31/2014  HPI/Events of Note  A fib with RVR.  She is DNR.  On BiPAP.  eICU Interventions  Will give lopressor 5 mg IV x one.     Intervention Category Major Interventions: Other:  Sunni Richardson 05/31/2014, 9:22 PM

## 2014-05-31 NOTE — Consult Note (Signed)
NAMENAKEDA, LEBRON             ACCOUNT NO.:  [id number]  MEDICAL RECORD NO.:  [id number]  LOCATION:  IC09                          FACILITY:  [facility]  PHYSICIAN:  Khaiden Segreto L. Luan Pulling, M.D.DATE OF BIRTH:  Sep 22, 1922  DATE OF CONSULTATION: DATE OF DISCHARGE:                                CONSULTATION   REASON FOR CONSULTATION:  Respiratory failure.  HISTORY:  Ms. Rembold is an 80 year old Caucasian female, who was in her usual state of fair health at home when she developed increasing problems with shortness of breath.  She fell and was brought to the emergency department and in the emergency department, she was noted to have pneumonia.  She was admitted to the hospital because of that.  She was being treated with standard medication for pneumonia when she developed markedly increased problems with shortness of breath and hypoxia, and was transferred to the Intensive Care Unit.  Chest x-ray then showed bilateral infiltrates and significant increase in the previously noted pneumonia.  According to her great niece who has power of attorney, Ms. Weyland has been living alone, but that was not felt to be an ideal situation.  She has had multiple falls.  She has some breathing trouble at baseline.  REVIEW OF SYSTEMS:  Not obtainable now and really cannot get much history from her.  PAST MEDICAL HISTORY:  Known to be positive for pulmonary fibrosis, hip fracture, anxiety, diabetes, hypertension, hyperlipidemia, GERD, glaucoma, previous stroke.  PAST SURGICAL HISTORY:  Surgically, she has had bilateral oophorectomy, surgery for a hip fracture, foot surgery, tonsillectomy and cataract surgery.  SOCIAL HISTORY:  As mentioned, she lives at home alone.  MEDICATIONS:  Medications are as noted on her home medication list and apparently are accurate.  ALLERGIES:  SHE IS ALLERGIC TO ACE INHIBITORS, BACTRIM, LIPITOR.  SOCIAL HISTORY:  She does not smoke.  She does use smokeless tobacco. She  does not use any alcohol or drugs.  FAMILY HISTORY:  Her brother had lung cancer and there is a family history of diabetes as well.  PHYSICAL EXAMINATION:  GENERAL:  Shows a thin female, who is in moderate- to-severe distress.  She is on BiPAP.  She has mitts on her hands to keep her from pulling the mask off. VITAL SIGNS:  Her respirations are about 35.  Her heart rate is about 130.  She looks uncomfortable. HEENT:  Pupils are reactive.  Nose and throat are clear. CHEST:  Marked bilateral rhonchi and wheezes. HEART:  Regular with tachycardia. ABDOMEN:  Soft. EXTREMITIES:  She does not have any edema. CENTRAL NERVOUS SYSTEM:  Grossly intact with no focal abnormality.  ASSESSMENT:  My assessment then is that she has pneumonia, which has progressed since admission.  She has acute respiratory failure and is on BiPAP.  I discussed her situation with her great niece who has power of attorney and although, she had initially thought that she would want intubation and mechanical ventilation, she is reconsidering that and will let us know.  In the meantime, would continue with antibiotics, bronchodilators, etc.     Terease Marcotte L. Luan Pulling, M.D.     ELH/MEDQ  D:  05/30/2014  T:  05/31/2014  Job:  [id number]

## 2014-05-31 NOTE — Progress Notes (Addendum)
PROGRESS NOTE  Mary Church MBW:[id number] DOB: Feb 24, 1923 DOA: 05/28/2014 PCP: Redge Gainer, MD  Summary: 23yow presented with h/o generalized weakness, cough; became so weak waling at home almost passed out. No syncope. Was able to lie down, called EMS, SBP low 90s. Admitted for CAP.   Assessment/Plan: 1. CAP with sepsis and hypotension on admission. Hemodynamics stable. 2. Profound acute hypoxic respiratory failure, BiPAP dependent. Secondary to pneumonia, sepsis.appears improved today. 3. Acute encephalopathy. Appears to be resolving with some improvement in clinical condition. 4. Normocytic anemia, slightly lower today.suspect anemia of critical illness. 5. Generalized weakness, plan physical therapy consult when improved. 6. Pulmonary fibrosis.  7. DM type 2. Capillary blood sugars remained stable. 8. Anxiety  9. RUL lung nodule. F/u as outpatient.    Overall improved today although she remains BiPAP dependent. Discussed with Dr. Luan Pulling. Plan to continue empiric antibiotics and BiPAP and monitor.  Plan to update power of attorney later today--addendum 2100 updated POA by telephone, understands prognosis guarded, BiPAP dependent, many not survive this hospitalization.  Code Status: DNR/DNI DVT prophylaxis: Lovenox Family Communication:  Disposition Plan: pending  Murray Hodgkins, MD  [facility]  Pager [phone] If 7PM-7AM, please contact night-coverage at [email or web address], password [facility] 05/31/2014, 7:59 AM  LOS: 3 days   Consultants:    Procedures:    Antibiotics:  Azithromycin 11/10 >>  Ceftriaxone 11/10 >>  Vancomycin 11/11 >>  HPI/Subjective: Could not tolerate being off BiPAP except very briefly.  She feels better today. She is on BiPAP and unable to speak, more calm, mitts off.  Objective: Filed Vitals:   05/31/14 0500 05/31/14 0503 05/31/14 0504 05/31/14 0747  BP: 140/37     Pulse: 72 66 69   Temp:    98.4 F (36.9 C)  TempSrc:     Axillary  Resp: 25 18 21    Height:      Weight: 55.2 kg (121 lb 11.1 oz)     SpO2: 86% 92% 91%     Intake/Output Summary (Last 24 hours) at 05/31/14 0759 Last data filed at 05/31/14 0500  Gross per 24 hour  Intake    500 ml  Output    425 ml  Net     75 ml     Filed Weights   05/28/14 1838 05/28/14 2355 05/31/14 0500  Weight: 51.71 kg (114 lb) 53.9 kg (118 lb 13.3 oz) 55.2 kg (121 lb 11.1 oz)    Exam:     Afebrile, vitals signs stable, stable hypoxia BiPAP. 86% on nonrebreather.  Gen. Appears calm and comfortable today. Anxiety seems to have resolved. Appears to be tolerating BiPAP.  Cardiovascular regular rate and rhythm. No murmur, rub or gallop. No lower extremity edema.  Respiratory clear to auscultation bilaterally. Improved air movement. No wheezing, rales or rhonchi. Decreased respiratory effort.  Abdomen soft, nontender, nondistended  Musculus causal appears grossly unremarkable  Neurologic appears grossly unremarkable  Data Reviewed:  Urine output 425  Capillary blood sugar stable  ABG 7.43/33/57  Potassium 3.4. Basic metabolic panel otherwise unremarkable.  WBC up to 15. Hemoglobin slightly lower, 9.4.Platelets normal.  Blood cultures no growth this far.  Sputum culture ordered 11/11 but thus far unable to be obtained  Chest x-ray with bilateral pneumonia, no significant change  Scheduled Meds: . allopurinol  200 mg Oral Daily  . antiseptic oral rinse  7 mL Mouth Rinse q12n4p  . azithromycin  500 mg Intravenous Q24H  . cefTRIAXone (ROCEPHIN)  IV  1 g Intravenous Q24H  .  chlorhexidine  15 mL Mouth Rinse BID  . cholecalciferol  1,000 Units Oral Daily  . citalopram  20 mg Oral Daily  . enoxaparin (LOVENOX) injection  40 mg Subcutaneous Q24H  . felodipine  10 mg Oral Daily  . insulin aspart  0-9 Units Subcutaneous Q6H  . ipratropium-albuterol  3 mL Nebulization Q4H  . vancomycin  750 mg Intravenous Q24H   Continuous Infusions:   Principal  Problem:   CAP (community acquired pneumonia) Active Problems:   Anxiety   Diabetes mellitus type 2, controlled, without complications   HTN (hypertension)   Pulmonary fibrosis   Tricuspid regurgitation   SIRS (systemic inflammatory response syndrome)   Weakness generalized   Hypoxia   Sepsis   Time spent 25 minutes

## 2014-05-31 NOTE — Plan of Care (Signed)
Problem: Phase I Progression Outcomes Goal: Dyspnea controlled at rest Outcome: Progressing Patient remains confused and dyspneic at present Goal: Pain controlled with appropriate interventions Outcome: Progressing No complaints of pain Goal: OOB as tolerated unless otherwise ordered Outcome: Not Progressing Bedrest tonight Goal: First antibiotic given within 6hrs of admit Outcome: Completed/Met Date Met:  05/31/14 Goal: Confirm chest x-ray completed Outcome: Completed/Met Date Met:  05/31/14 Goal: Consider pulmonary consult Outcome: Completed/Met Date Met:  05/31/14 Goal: Code status addressed with pt/family Outcome: Completed/Met Date Met:  05/31/14 Patient is a DNR Goal: Initial discharge plan identified Outcome: Progressing Goal: Hemodynamically stable Outcome: Completed/Met Date Met:  05/31/14

## 2014-05-31 NOTE — Progress Notes (Signed)
Attempted to take patient off of Bipap due to anxiety level and she tolerated for only brief period on VM 40% then 50%, then non-rebreather 100%, then back on Bipap.  Patient is cooperative and in need of much reassurance.

## 2014-06-01 LAB — BASIC METABOLIC PANEL
Anion gap: 14 (ref 5–15)
BUN: 33 mg/dL — ABNORMAL HIGH (ref 6–23)
CO2: 23 meq/L (ref 19–32)
Calcium: 8.8 mg/dL (ref 8.4–10.5)
Chloride: 103 mEq/L (ref 96–112)
Creatinine, Ser: 0.77 mg/dL (ref 0.50–1.10)
GFR calc Af Amer: 84 mL/min — ABNORMAL LOW (ref >90)
GFR, EST NON AFRICAN AMERICAN: 72 mL/min — AB (ref >90)
Glucose, Bld: 129 mg/dL — ABNORMAL HIGH (ref 70–99)
POTASSIUM: 3.4 meq/L — AB (ref 3.7–5.3)
SODIUM: 140 meq/L (ref 137–147)

## 2014-06-01 LAB — CBC
HEMATOCRIT: 27.9 % — AB (ref 36.0–46.0)
Hemoglobin: 9.8 g/dL — ABNORMAL LOW (ref 12.0–15.0)
MCH: 31.1 pg (ref 26.0–34.0)
MCHC: 35.1 g/dL (ref 30.0–36.0)
MCV: 88.6 fL (ref 78.0–100.0)
Platelets: 308 10*3/uL (ref 150–400)
RBC: 3.15 MIL/uL — AB (ref 3.87–5.11)
RDW: 13.5 % (ref 11.5–15.5)
WBC: 13.3 10*3/uL — AB (ref 4.0–10.5)

## 2014-06-01 LAB — GLUCOSE, CAPILLARY
GLUCOSE-CAPILLARY: 159 mg/dL — AB (ref 70–99)
GLUCOSE-CAPILLARY: 112 mg/dL — AB (ref 70–99)
GLUCOSE-CAPILLARY: 143 mg/dL — AB (ref 70–99)

## 2014-06-01 MED ORDER — MORPHINE SULFATE 2 MG/ML IJ SOLN
2.0000 mg | INTRAMUSCULAR | Status: DC | PRN
  Administered 2014-06-01 – 2014-06-04 (×18): 2 mg via INTRAVENOUS
  Filled 2014-06-01 (×18): qty 1

## 2014-06-01 MED ORDER — LORAZEPAM 2 MG/ML IJ SOLN: 1.0000 mg | Freq: Four times a day (QID) | INTRAMUSCULAR | Status: DC | PRN

## 2014-06-01 MED ORDER — LORAZEPAM 2 MG/ML IJ SOLN
1.0000 mg | INTRAMUSCULAR | Status: DC | PRN
  Administered 2014-06-01 – 2014-06-04 (×7): 1 mg via INTRAVENOUS
  Filled 2014-06-01 (×8): qty 1

## 2014-06-01 MED ORDER — METOPROLOL TARTRATE 1 MG/ML IV SOLN
5.0000 mg | Freq: Four times a day (QID) | INTRAVENOUS | Status: DC
  Administered 2014-06-01: 5 mg via INTRAVENOUS
  Filled 2014-06-01: qty 5

## 2014-06-01 MED ORDER — MORPHINE SULFATE 2 MG/ML IJ SOLN
INTRAMUSCULAR | Status: AC
  Filled 2014-06-01: qty 1

## 2014-06-01 MED ORDER — MORPHINE SULFATE 2 MG/ML IJ SOLN: 1.0000 mg | INTRAMUSCULAR | Status: DC | PRN

## 2014-06-01 MED ORDER — INSULIN ASPART 100 UNIT/ML ~~LOC~~ SOLN: 0.0000 [IU] | Freq: Four times a day (QID) | SUBCUTANEOUS | Status: DC

## 2014-06-01 NOTE — Progress Notes (Signed)
Subjective: She is awake and still on BiPAP. She's had an episode of atrial fibrillation with rapid ventricular response and received Lopressor and her heart rate is better now. She still has a respiratory rate of about 30.  Objective: Vital signs in last 24 hours: Temp:  [97.9 F (36.6 C)-99.6 F (37.6 C)] 97.9 F (36.6 C) (11/13 0400) Pulse Rate:  [49-161] 132 (11/13 0744) Resp:  [15-32] 31 (11/13 0744) BP: (94-146)/(38-104) 146/43 mmHg (11/13 0700) SpO2:  [84 %-100 %] 93 % (11/13 0744) FiO2 (%):  [60 %-100 %] 80 % (11/13 0744) Weight:  [54.9 kg (121 lb 0.5 oz)] 54.9 kg (121 lb 0.5 oz) (11/13 0500) Weight change: -0.3 kg (-10.6 oz) Last BM Date: 05/31/14  Intake/Output from previous day: 11/12 0701 - 11/13 0700 In: 1591.7 [I.V.:1141.7; IV Piggyback:450] Out: 200 [Urine:200]  PHYSICAL EXAM General appearance: alert and mild distress Resp: rhonchi bilaterally Cardio: irregularly irregular rhythm GI: soft, non-tender; bowel sounds normal; no masses,  no organomegaly Extremities: extremities normal, atraumatic, no cyanosis or edema  Lab Results:  Results for orders placed or performed during the hospital encounter of 05/28/14 (from the past 48 hour(s))  Glucose, capillary     Status: Abnormal   Collection Time: 05/30/14 11:44 AM  Result Value Ref Range   Glucose-Capillary 155 (H) 70 - 99 mg/dL   Comment 1 Notify RN    Comment 2 Documented in Chart   Blood gas, arterial     Status: Abnormal   Collection Time: 05/30/14  2:00 PM  Result Value Ref Range   FIO2 40.00 %   Delivery systems OXYGEN MASK    pH, Arterial 7.342 (L) 7.350 - 7.450   pCO2 arterial 37.9 35.0 - 45.0 mmHg   pO2, Arterial 41.2 (L) 80.0 - 100.0 mmHg   Bicarbonate 20.0 20.0 - 24.0 mEq/L   TCO2 17.8 0 - 100 mmol/L   Acid-base deficit 4.8 (H) 0.0 - 2.0 mmol/L   O2 Saturation 68.4 %   Patient temperature 37.0    Collection site RIGHT BRACHIAL    Drawn by COLLECTED BY RT    Sample type ARTERIAL    Allens  test (pass/fail) NOT INDICATED (A) PASS  MRSA PCR Screening     Status: None   Collection Time: 05/30/14  3:44 PM  Result Value Ref Range   MRSA by PCR NEGATIVE NEGATIVE    Comment:        The GeneXpert MRSA Assay (FDA approved for NASAL specimens only), is one component of a comprehensive MRSA colonization surveillance program. It is not intended to diagnose MRSA infection nor to guide or monitor treatment for MRSA infections.   Glucose, capillary     Status: Abnormal   Collection Time: 05/30/14  5:13 PM  Result Value Ref Range   Glucose-Capillary 146 (H) 70 - 99 mg/dL   Comment 1 Documented in Chart    Comment 2 Notify RN   Glucose, capillary     Status: Abnormal   Collection Time: 05/30/14  6:49 PM  Result Value Ref Range   Glucose-Capillary 156 (H) 70 - 99 mg/dL   Comment 1 Documented in Chart    Comment 2 Notify RN   Glucose, capillary     Status: Abnormal   Collection Time: 05/31/14 12:24 AM  Result Value Ref Range   Glucose-Capillary 222 (H) 70 - 99 mg/dL  Blood gas, arterial     Status: Abnormal   Collection Time: 05/31/14  4:30 AM  Result Value Ref Range  FIO2 70.00 %   Delivery systems BILEVEL POSITIVE AIRWAY PRESSURE    Inspiratory PAP 14.0    Expiratory PAP 7.0    pH, Arterial 7.435 7.350 - 7.450   pCO2 arterial 33.1 (L) 35.0 - 45.0 mmHg   pO2, Arterial 57.8 (L) 80.0 - 100.0 mmHg   Bicarbonate 21.9 20.0 - 24.0 mEq/L   TCO2 18.3 0 - 100 mmol/L   Acid-Base Excess 1.8 0.0 - 2.0 mmol/L   O2 Saturation 88.7 %   Collection site RIGHT RADIAL    Drawn by [id number]    Sample type ARTERIAL    Allens test (pass/fail) PASS PASS  Basic metabolic panel     Status: Abnormal   Collection Time: 05/31/14  5:53 AM  Result Value Ref Range   Sodium 139 137 - 147 mEq/L   Potassium 3.4 (L) 3.7 - 5.3 mEq/L   Chloride 99 96 - 112 mEq/L   CO2 24 19 - 32 mEq/L   Glucose, Bld 131 (H) 70 - 99 mg/dL   BUN 35 (H) 6 - 23 mg/dL   Creatinine, Ser 1.03 0.50 - 1.10 mg/dL   Calcium  9.0 8.4 - 10.5 mg/dL   GFR calc non Af Amer 47 (L) >90 mL/min   GFR calc Af Amer 54 (L) >90 mL/min    Comment: (NOTE) The eGFR has been calculated using the CKD EPI equation. This calculation has not been validated in all clinical situations. eGFR's persistently <90 mL/min signify possible Chronic Kidney Disease.    Anion gap 16 (H) 5 - 15  CBC     Status: Abnormal   Collection Time: 05/31/14  5:53 AM  Result Value Ref Range   WBC 15.0 (H) 4.0 - 10.5 K/uL   RBC 3.02 (L) 3.87 - 5.11 MIL/uL   Hemoglobin 9.4 (L) 12.0 - 15.0 g/dL   HCT 26.8 (L) 36.0 - 46.0 %   MCV 88.7 78.0 - 100.0 fL   MCH 31.1 26.0 - 34.0 pg   MCHC 35.1 30.0 - 36.0 g/dL   RDW 13.4 11.5 - 15.5 %   Platelets 277 150 - 400 K/uL    Comment: DELTA CHECK NOTED  Glucose, capillary     Status: Abnormal   Collection Time: 05/31/14  7:29 AM  Result Value Ref Range   Glucose-Capillary 109 (H) 70 - 99 mg/dL   Comment 1 Documented in Chart    Comment 2 Notify RN   Glucose, capillary     Status: Abnormal   Collection Time: 05/31/14 12:06 PM  Result Value Ref Range   Glucose-Capillary 133 (H) 70 - 99 mg/dL   Comment 1 Documented in Chart    Comment 2 Notify RN   Glucose, capillary     Status: Abnormal   Collection Time: 05/31/14  6:45 PM  Result Value Ref Range   Glucose-Capillary 119 (H) 70 - 99 mg/dL   Comment 1 Documented in Chart    Comment 2 Notify RN   Glucose, capillary     Status: Abnormal   Collection Time: 06/01/14  1:15 AM  Result Value Ref Range   Glucose-Capillary 159 (H) 70 - 99 mg/dL  Glucose, capillary     Status: Abnormal   Collection Time: 06/01/14  6:42 AM  Result Value Ref Range   Glucose-Capillary 112 (H) 70 - 99 mg/dL  Basic metabolic panel     Status: Abnormal   Collection Time: 06/01/14  7:13 AM  Result Value Ref Range   Sodium 140 137 - 147  mEq/L   Potassium 3.4 (L) 3.7 - 5.3 mEq/L   Chloride 103 96 - 112 mEq/L   CO2 23 19 - 32 mEq/L   Glucose, Bld 129 (H) 70 - 99 mg/dL   BUN 33 (H) 6  - 23 mg/dL   Creatinine, Ser 0.77 0.50 - 1.10 mg/dL   Calcium 8.8 8.4 - 10.5 mg/dL   GFR calc non Af Amer 72 (L) >90 mL/min   GFR calc Af Amer 84 (L) >90 mL/min    Comment: (NOTE) The eGFR has been calculated using the CKD EPI equation. This calculation has not been validated in all clinical situations. eGFR's persistently <90 mL/min signify possible Chronic Kidney Disease.    Anion gap 14 5 - 15  CBC     Status: Abnormal   Collection Time: 06/01/14  7:13 AM  Result Value Ref Range   WBC 13.3 (H) 4.0 - 10.5 K/uL   RBC 3.15 (L) 3.87 - 5.11 MIL/uL   Hemoglobin 9.8 (L) 12.0 - 15.0 g/dL   HCT 27.9 (L) 36.0 - 46.0 %   MCV 88.6 78.0 - 100.0 fL   MCH 31.1 26.0 - 34.0 pg   MCHC 35.1 30.0 - 36.0 g/dL   RDW 13.5 11.5 - 15.5 %   Platelets 308 150 - 400 K/uL    ABGS  Recent Labs  05/31/14 0430  PHART 7.435  PO2ART 57.8*  TCO2 18.3  HCO3 21.9   CULTURES Recent Results (from the past 240 hour(s))  Blood Culture (routine x 2)     Status: None (Preliminary result)   Collection Time: 05/28/14 11:05 PM  Result Value Ref Range Status   Specimen Description BLOOD LEFT ARM  Final   Special Requests BOTTLES DRAWN AEROBIC AND ANAEROBIC [place] EACH  Final   Culture NO GROWTH 3 DAYS  Final   Report Status PENDING  Incomplete  Blood Culture (routine x 2)     Status: None (Preliminary result)   Collection Time: 05/28/14 11:13 PM  Result Value Ref Range Status   Specimen Description BLOOD RIGHT HAND  Final   Special Requests   Final    BOTTLES DRAWN AEROBIC AND ANAEROBIC AEB 10CC ANA [place]   Culture NO GROWTH 3 DAYS  Final   Report Status PENDING  Incomplete  Culture, blood (routine x 2) Call MD if unable to obtain prior to antibiotics being given     Status: None (Preliminary result)   Collection Time: 05/29/14  8:03 AM  Result Value Ref Range Status   Specimen Description Blood RIGHT ARM  Final   Special Requests   Final    BOTTLES DRAWN AEROBIC AND ANAEROBIC 6 CC EACH BOTTLE   Culture NO  GROWTH 2 DAYS  Final   Report Status PENDING  Incomplete  Culture, blood (routine x 2) Call MD if unable to obtain prior to antibiotics being given     Status: None (Preliminary result)   Collection Time: 05/29/14  8:03 AM  Result Value Ref Range Status   Specimen Description Blood LEFT ARM  Final   Special Requests   Final    BOTTLES DRAWN AEROBIC AND ANAEROBIC 6 CC EACH BOTTLE   Culture NO GROWTH 2 DAYS  Final   Report Status PENDING  Incomplete  MRSA PCR Screening     Status: None   Collection Time: 05/30/14  3:44 PM  Result Value Ref Range Status   MRSA by PCR NEGATIVE NEGATIVE Final    Comment:  The GeneXpert MRSA Assay (FDA approved for NASAL specimens only), is one component of a comprehensive MRSA colonization surveillance program. It is not intended to diagnose MRSA infection nor to guide or monitor treatment for MRSA infections.    Studies/Results: Dg Chest Port 1 View  05/31/2014   CLINICAL DATA:  Respiratory failure  EXAM: PORTABLE CHEST - 1 VIEW  COMPARISON:  Portable chest x-ray of May 30, 2014 and May 28, 2014  FINDINGS: The lungs are well-expanded. Confluent alveolar infiltrates are present bilaterally and are little changed since yesterday's study but are considerably more pronounced than on the earlier exam. The cardiac silhouette is normal in size. The pulmonary vascularity is indistinct but not clearly engorged. There is no pleural effusion. The trachea is midline. The bony thorax exhibits no acute abnormality.  IMPRESSION: Confluent alveolar opacities are consistent with bilateral pneumonia or other alveolar filling processes including noncardiac pulmonary edema. There has been little change since yesterday's study.   Electronically Signed   By: David  Martinique   On: 05/31/2014 07:36   Dg Chest Port 1 View  05/30/2014   CLINICAL DATA:  Hypoxia. Shortness of breath. Pulmonary fibrosis. Diabetes.  EXAM: PORTABLE CHEST - 1 VIEW  COMPARISON:  05/28/2014   FINDINGS: Severe progression of bilateral airspace opacities noted, with extensive new left perihilar and basilar airspace opacities, increased in the right upper lobe opacity, and increase in the right perihilar and basilar airspace opacity. Heart size remains within normal limits.  Atherosclerotic aortic arch.  IMPRESSION: 1. Worsened considerable bilateral airspace opacities suggesting acute bilateral pneumonia, noncardiogenic edema, or pulmonary hemorrhage.   Electronically Signed   By: Sherryl Barters M.D.   On: 05/30/2014 14:29    Medications:  Prior to Admission:  Prescriptions prior to admission  Medication Sig Dispense Refill Last Dose  . allopurinol (ZYLOPRIM) 100 MG tablet Take 2 tablets (200 mg total) by mouth daily. 60 tablet 11 05/28/2014 at Unknown time  . ALPRAZolam (XANAX) 0.5 MG tablet Take 0.5 mg by mouth 3 (three) times daily as needed for anxiety.   05/28/2014 at Unknown time  . cholecalciferol (VITAMIN D) 1000 UNITS tablet Take 1,000 Units by mouth daily.   05/27/2014 at Unknown time  . citalopram (CELEXA) 20 MG tablet Take 1 tablet (20 mg total) by mouth daily. 30 tablet 3 05/28/2014 at Unknown time  . felodipine (PLENDIL) 10 MG 24 hr tablet Take 1 tablet (10 mg total) by mouth daily. 30 tablet 11 05/28/2014 at Unknown time  . lisinopril-hydrochlorothiazide (PRINZIDE,ZESTORETIC) 20-25 MG per tablet Take 1 tablet by mouth daily. 30 tablet 11 05/28/2014 at Unknown time  . metFORMIN (GLUCOPHAGE) 500 MG tablet Take 500 mg by mouth every morning.   05/28/2014 at Unknown time  . methylcellulose (ARTIFICIAL TEARS) 1 % ophthalmic solution Place 1 drop into both eyes daily.   05/28/2014 at Unknown time  . Multiple Vitamin (MULTIVITAMIN) tablet Take 1 tablet by mouth daily.   05/28/2014 at Unknown time  . rosuvastatin (CRESTOR) 10 MG tablet Take 1 tablet (10 mg total) by mouth daily. (Patient taking differently: Take 10 mg by mouth every other day. ) 30 tablet 11 05/26/2014 at Unknown time  .  ALPRAZolam (XANAX) 0.5 MG tablet TAKE  (1)  TABLET  THREE TIMES DAILY. (Patient not taking: Reported on 05/28/2014) 90 tablet 1   . glucose blood (TRUETRACK TEST) test strip Test blood sugar once a day, Dx 250.00 (Patient not taking: Reported on 05/28/2014) 100 each 2 Taking  . metFORMIN (GLUCOPHAGE)  500 MG tablet TAKE 1 TABLET IN MORNING (Patient not taking: Reported on 05/28/2014) 30 tablet 11    Scheduled: . allopurinol  200 mg Oral Daily  . antiseptic oral rinse  7 mL Mouth Rinse q12n4p  . azithromycin  500 mg Intravenous Q24H  . cefTRIAXone (ROCEPHIN)  IV  1 g Intravenous Q24H  . chlorhexidine  15 mL Mouth Rinse BID  . cholecalciferol  1,000 Units Oral Daily  . citalopram  20 mg Oral Daily  . enoxaparin (LOVENOX) injection  40 mg Subcutaneous Q24H  . felodipine  10 mg Oral Daily  . Influenza vac split quadrivalent PF  0.5 mL Intramuscular Tomorrow-1000  . insulin aspart  0-9 Units Subcutaneous Q6H  . ipratropium-albuterol  3 mL Nebulization Q4H  . vancomycin  750 mg Intravenous Q24H   Continuous: . sodium chloride 125 mL/hr at 06/01/14 0600   KCM:[id number], LORazepam, morphine injection  Assesment:she was admitted with community-acquired pneumonia. She was started on standard treatment but did not improve and in fact got much worse and chest x-ray showed that her pneumonia had progressed and was multilobar. She was transferred to stepdown and placed on BiPAP. She now has DO NOT RESUSCITATE status. She has been septic. She had tachycardia that initially appeared to be an SVT but now is atrial fibrillation. She is still requiring BiPAP but looks calmer. Her heart rate is better controlled after Lopressor and her respiratory rate however is still about 30 . Principal Problem:   CAP (community acquired pneumonia) Active Problems:   Anxiety   Diabetes mellitus type 2, controlled, without complications   HTN (hypertension)   Pulmonary fibrosis   Tricuspid regurgitation   SIRS (systemic  inflammatory response syndrome)   Weakness generalized   Hypoxia   Sepsis    Plan:I don't think there is anything really to add at this point. Continue with antibiotics BiPAP etc. I will be out of town for the next 2 days but will see her on my return    LOS: 4 days   Jiro Kiester L 06/01/2014, 8:07 AM

## 2014-06-01 NOTE — Progress Notes (Addendum)
PROGRESS NOTE  Mary Church IOX:[id number] DOB: February 15, 1923 DOA: 05/28/2014 PCP: Redge Gainer, MD  Summary: 54yow presented with h/o generalized weakness, cough; became so weak waling at home almost passed out. No syncope. Was able to lie down, called EMS, SBP low 90s. Admitted for CAP.   Assessment/Plan: 1. CAP with sepsis.  2. Profound acute hypoxic respiratory failure, remains BiPAP dependent. Secondary to pneumonia, sepsis. Far worse today. 3. Acute encephalopathy. Worsening secondary to acute illness. 4. Normocytic anemia, stable. Suspect anemia of critical illness. 5. Generalized weakness 6. Pulmonary fibrosis.  7. DM type 2. Capillary blood sugars remain stable. 8. Anxiety    Patient remains BiPAP dependent with profound hypoxia secondary to severe bilateral pneumonia with sepsis. She has made no progress in regard to her respiratory status and has now become more encephalopathic again and more agitated. Discussed with Dr. Luan Pulling. Discussed in detail with healthcare power of attorney Pricilla Loveless at bedside. We reviewed treatment options including continuing BiPAP and antibiotics. Rip Harbour feels that she is suffering and given lack in overall improvement desires transition to full comfort care, discontinue antibiotics and all curative efforts, discontinue oxygen. Focus care on comfort with morphine and Ativan as needed. I think this is in the patient's best interests. I explained to her that death is anticipated in the next 24 hours and she understands.  RN Rojelio Brenner and Gabriel Cirri patient advocate witnessed conversation  Code Status: DNR/DNI DVT prophylaxis: Lovenox Family Communication:  Disposition Plan:   Murray Hodgkins, MD  [facility]  Pager [phone] If 7PM-7AM, please contact night-coverage at [email or web address], password [facility] 06/01/2014, 12:17 PM  LOS: 4 days   Consultants:  pulmonology  Procedures:    Antibiotics:  Azithromycin 11/10 >>11/13  Ceftriaxone  11/10 >>11/13  Vancomycin 11/11 >>11/13  HPI/Subjective: Restless overnight, more anxious and uncomfortable today. Cannot tolerate being off BiPAP. Episode of atrial fibrillation overnight treated with metoprolol.  She is unable to offer history secondary to distress.  Objective: Filed Vitals:   06/01/14 0900 06/01/14 1000 06/01/14 1123 06/01/14 1151  BP: 139/68 125/64    Pulse:   107   Temp:    98 F (36.7 C)  TempSrc:    Oral  Resp: 27 31 29    Height:      Weight:      SpO2: 93% 90%      Intake/Output Summary (Last 24 hours) at 06/01/14 1217 Last data filed at 06/01/14 0600  Gross per 24 hour  Intake 1591.67 ml  Output    200 ml  Net 1391.67 ml     Filed Weights   05/28/14 2355 05/31/14 0500 06/01/14 0500  Weight: 53.9 kg (118 lb 13.3 oz) 55.2 kg (121 lb 11.1 oz) 54.9 kg (121 lb 0.5 oz)    Exam:     Afebrile, tachypneic 30s, blood pressure stable, heart rate 100-130s. Oxygenation low 90s on BiPAP.  Gen. Appears restless, uncomfortable, anxious. Appears critically ill.  Psychiatric. Unable to assess.  Cardiovascular. Tachycardic, regular. No murmur, rub or gallop. No lower extremity edema. Telemetry sinus tachycardia.  Respiratory fair air movement bilaterally with coarse breath sounds. No frank wheezes, rales or rhonchi. Tachypnea. Severe increased respiratory effort.  Abdomen soft, nontender, nondistended  Skin perfusion appears grossly intact the extremities  Muscular skeletal grossly normal tone  Neurologic cannot assess  Data Reviewed:  Urine output 200  BM 1  Potassium 3.4. Basic metabolic panel otherwise unremarkable.  WBC slightly improved, 13.3. Hemoglobin 9.8, stable.  Blood cultures no growth to date  Scheduled Meds: . chlorhexidine  15 mL Mouth Rinse BID   Continuous Infusions:   Principal Problem:   CAP (community acquired pneumonia) Active Problems:   Anxiety   Diabetes mellitus type 2, controlled, without complications    HTN (hypertension)   Pulmonary fibrosis   Tricuspid regurgitation   SIRS (systemic inflammatory response syndrome)   Weakness generalized   Hypoxia   Sepsis   Time spent 35 minutes, greater than 50% in counseling and coordination of care

## 2014-06-02 DIAGNOSIS — G9340 Encephalopathy, unspecified: Secondary | ICD-10-CM | POA: Insufficient documentation

## 2014-06-02 DIAGNOSIS — J9601 Acute respiratory failure with hypoxia: Secondary | ICD-10-CM | POA: Insufficient documentation

## 2014-06-02 LAB — CULTURE, BLOOD (ROUTINE X 2)
CULTURE: NO GROWTH
CULTURE: NO GROWTH

## 2014-06-02 NOTE — Progress Notes (Signed)
  PROGRESS NOTE  Mary Church TOI:[id number] DOB: [birth year] DOA: 05/28/2014 PCP: Redge Gainer, MD  Summary: 89yow presented with h/o generalized weakness, cough; became so weak waling at home almost passed out. No syncope. Was able to lie down, called EMS, SBP low 90s. Admitted for CAP. Condition quickly worsened with profound hypoxic respiratory failure and the patient was transferred to the stepdown unit and placed on BiPAP. Antibiotics were broadened and patient was seen by pulmonology in consultation. In consultation with healthcare power of attorney she was made DO NOT RESUSCITATE/DO NOT INTUBATE. Despite BiPAP, broad-spectrum antibiotics and aggressive care her condition failed to improve and after further conference, the patient was made full comfort care. She continues to decline and death is expected in the next 24 hours. If condition stabilizes, consideration will be given to hospice.  Assessment/Plan: 1. CAP with sepsis. Did not respond antibiotics or BiPAP. 2. Profound acute hypoxic respiratory failure. Secondary to pneumonia, sepsis.  3. Acute encephalopathy. Secondary to acute illness. 4. Anemia of critical illness 5. Generalized weakness 6. Pulmonary fibrosis.  7. DM type 2   Patient appears worse, expect death within the next 24 hours.  Morphine appears to be effective. Continue full comfort care.  Transfer to medical bed  Code Status: DNR/DNI, comfort care DVT prophylaxis: not indicated Family Communication:  Disposition Plan:   Murray Hodgkins, MD  [facility]  Pager [phone] If 7PM-7AM, please contact night-coverage at [email or web address], password [facility] 06/02/2014, 9:21 AM  LOS: 5 days   Consultants:  pulmonology  Procedures:    Antibiotics:  Azithromycin 11/10 >>11/13  Ceftriaxone 11/10 >>11/13  Vancomycin 11/11 >>11/13  HPI/Subjective: Periods of restlessness last night per family. Morphine effective. Currently resting comfortably. 2 sisters  at bedside.  Objective: Filed Vitals:   06/01/14 1600 06/01/14 1940 06/01/14 2000 06/02/14 0805  BP:      Pulse:      Temp: 98 F (36.7 C)  96.4 F (35.8 C) 96.5 F (35.8 C)  TempSrc:    Axillary  Resp:      Height:      Weight:      SpO2:  59%      Intake/Output Summary (Last 24 hours) at 06/02/14 0921 Last data filed at 06/01/14 2234  Gross per 24 hour  Intake      0 ml  Output    550 ml  Net   -550 ml     Filed Weights   05/28/14 2355 05/31/14 0500 06/01/14 0500  Weight: 53.9 kg (118 lb 13.3 oz) 55.2 kg (121 lb 11.1 oz) 54.9 kg (121 lb 0.5 oz)    Exam:     Temperature 96.5. SPO2 59%. Tachycardic.  Appears calm and comfortable currently.  Deep respirations  Cardiovascular tachycardic. Regular rhythm. No lower extremity edema.  Scheduled Meds: . chlorhexidine  15 mL Mouth Rinse BID   Continuous Infusions:   Principal Problem:   CAP (community acquired pneumonia) Active Problems:   Anxiety   Diabetes mellitus type 2, controlled, without complications   HTN (hypertension)   Pulmonary fibrosis   Tricuspid regurgitation   SIRS (systemic inflammatory response syndrome)   Weakness generalized   Hypoxia   Sepsis   Time spent 20 minutes

## 2014-06-03 DIAGNOSIS — IMO0001 Reserved for inherently not codable concepts without codable children: Secondary | ICD-10-CM | POA: Insufficient documentation

## 2014-06-03 LAB — CULTURE, BLOOD (ROUTINE X 2)
CULTURE: NO GROWTH
Culture: NO GROWTH

## 2014-06-03 NOTE — Progress Notes (Signed)
  PROGRESS NOTE  Mary Church YSA:[id number] DOB: 03-21-1923 DOA: 05/28/2014 PCP: Redge Gainer, MD  Summary: 64yow presented with h/o generalized weakness, cough; became so weak waling at home almost passed out. No syncope. Was able to lie down, called EMS, SBP low 90s. Admitted for CAP. Condition quickly worsened with profound hypoxic respiratory failure and the patient was transferred to the stepdown unit and placed on BiPAP. Antibiotics were broadened and patient was seen by pulmonology in consultation. In consultation with healthcare power of attorney she was made DO NOT RESUSCITATE/DO NOT INTUBATE. Despite BiPAP, broad-spectrum antibiotics and aggressive care her condition failed to improve and after further conference, the patient was made full comfort care. She continues to decline and death was expected rapdily. If condition stabilizes, consideration will be given to hospice.  Assessment/Plan: 1. CAP with sepsis.  2. Profound acute hypoxic respiratory failure. Secondary to pneumonia, sepsis.  3. Acute encephalopathy.  4. Anemia of critical illness 5. Generalized weakness 6. Pulmonary fibrosis.  7. DM type 2   Intermittent morphine has been effective in conjunction with Ativan. Continue full comfort care. Discussed with power of attorney at bedside.  Consider residential hospice  Code Status: DNR/DNI, comfort care DVT prophylaxis: not indicated Family Communication:  Disposition Plan:   Murray Hodgkins, MD  [facility]  Pager [phone] If 7PM-7AM, please contact night-coverage at [email or web address], password [facility] 06/03/2014, 4:55 PM  LOS: 6 days   Consultants:  pulmonology  Procedures:    Antibiotics:  Azithromycin 11/10 >>11/13  Ceftriaxone 11/10 >>11/13  Vancomycin 11/11 >>11/13  HPI/Subjective: Overall resting comfortably, morphine and Ativan at that effective. Multiple family members at bedside.  Objective: Filed Vitals:   06/03/14 0700 06/03/14 0826  06/03/14 0932 06/03/14 1338  BP:   124/50 126/60  Pulse: 80  49 76  Temp:  96.5 F (35.8 C) 97.7 F (36.5 C) 97.4 F (36.3 C)  TempSrc:  Axillary Axillary Oral  Resp:   18 18  Height:      Weight:      SpO2: 54%  55% 66%    Intake/Output Summary (Last 24 hours) at 06/03/14 1655 Last data filed at 06/03/14 1404  Gross per 24 hour  Intake      0 ml  Output    700 ml  Net   -700 ml     Filed Weights   05/28/14 2355 05/31/14 0500 06/01/14 0500  Weight: 53.9 kg (118 lb 13.3 oz) 55.2 kg (121 lb 11.1 oz) 54.9 kg (121 lb 0.5 oz)    Exam:     Afebrile, vital signs are stable except for profound hypoxia.  She appears calm and comfortable. Appears to be dying.  Deep, slow respirations.  Coarse breath sounds bilaterally. Extremities are warm.  Scheduled Meds: . chlorhexidine  15 mL Mouth Rinse BID   Continuous Infusions:   Principal Problem:   CAP (community acquired pneumonia) Active Problems:   Anxiety   Diabetes mellitus type 2, controlled, without complications   HTN (hypertension)   Pulmonary fibrosis   Tricuspid regurgitation   SIRS (systemic inflammatory response syndrome)   Weakness generalized   Hypoxia   Sepsis   Acute respiratory failure with hypoxia   Acute encephalopathy   Time spent 10 minutes

## 2014-06-03 NOTE — Progress Notes (Signed)
Pt to be transferred to unit 300. Family is aware and agreeable to transfer. RN has received report. All belongings transferred with pt.

## 2014-06-04 MED ORDER — MORPHINE SULFATE 2 MG/ML IJ SOLN
2.0000 mg | INTRAMUSCULAR | Status: DC | PRN
Start: 2014-06-04 — End: 2014-06-04

## 2014-06-04 MED ORDER — CETYLPYRIDINIUM CHLORIDE 0.05 % MT LIQD
7.0000 mL | Freq: Two times a day (BID) | OROMUCOSAL | Status: DC
  Administered 2014-06-04: 7 mL via OROMUCOSAL

## 2014-06-04 MED ORDER — LORAZEPAM 0.5 MG PO TABS: 0.5000 mg | ORAL_TABLET | ORAL | Status: AC | PRN

## 2014-06-04 MED ORDER — MORPHINE SULFATE (CONCENTRATE) 10 MG/0.5ML PO SOLN: 5.0000 mg | ORAL | Status: DC | PRN

## 2014-06-04 MED ORDER — LORAZEPAM 1 MG PO TABS: 1.0000 mg | ORAL_TABLET | ORAL | Status: DC | PRN

## 2014-06-04 MED ORDER — LORAZEPAM 2 MG/ML IJ SOLN: 1.0000 mg | INTRAMUSCULAR | Status: DC | PRN

## 2014-06-04 MED ORDER — LORAZEPAM 0.5 MG PO TABS: 0.5000 mg | ORAL_TABLET | ORAL | Status: DC | PRN

## 2014-06-04 MED ORDER — MORPHINE SULFATE (CONCENTRATE) 10 MG/0.5ML PO SOLN: 5.0000 mg | ORAL | Status: AC | PRN

## 2014-06-04 MED ORDER — MORPHINE SULFATE (CONCENTRATE) 10 MG/0.5ML PO SOLN
5.0000 mg | ORAL | Status: DC | PRN
Start: 2014-06-04 — End: 2014-06-04

## 2014-06-04 NOTE — Clinical Social Work Psychosocial (Signed)
Clinical Social Work Department BRIEF PSYCHOSOCIAL ASSESSMENT 06/04/2014  Patient:  Mary Church, Mary Church     Account Number:  [id number]     Admit date:  05/28/2014  Clinical Social Worker:  Wyatt Haste  Date/Time:  06/04/2014 11:43 AM  Referred by:  Physician  Date Referred:  06/04/2014 Referred for  Residential hospice placement   Other Referral:   Interview type:  Family Other interview type:   niece- Melinda    PSYCHOSOCIAL DATA Living Status:  ALONE Admitted from facility:   Level of care:   Primary support name:  Rip Harbour Primary support relationship to patient:  FAMILY Degree of support available:   supportive    CURRENT CONCERNS Current Concerns  Post-Acute Placement   Other Concerns:    SOCIAL WORK ASSESSMENT / PLAN CSW spoke with pt's niece/HCPOA, Rip Harbour following conversation with MD regarding goals of care and comfort care. Rip Harbour reports pt was living at home alone prior to admission. She does not have any children, but has neighbors who check on pt daily as well as sisters. She was admitted last week due to pneumonia and was on bipap. Pt was made DNR/DNI. She has continued to decline and decision was made for comfort care. MD discussed this with Rip Harbour again this morning and she is agreeable to residential hospice placement. Melinda requests [facility] first, with back up option at [facility]. No beds available today in [facility], and pt has been accepted at [facility]. Melinda notified and agreeable. CSW discussed option to transfer pt to [facility] when bed available and she feels it is best to leave pt in one place. She will complete paperwork there this evening after work. [facility] can accept pt this evening as well. D/C summary to be faxed upon completion. Pt will transport via [facility]. Out of facility DNR sent with pt.   Assessment/plan status:  Referral to [facility] Other assessment/ plan:    Information/referral to community resources:   [facility] and [facility]    PATIENT'S/FAMILY'S RESPONSE TO PLAN OF CARE: Pt's HCPOA, Rip Harbour agreeable to transfer to [place] today.       Benay Pike, [place]

## 2014-06-04 NOTE — Progress Notes (Signed)
  PROGRESS NOTE  Casey Maxfield DGL:[id number] DOB: [birth year] DOA: 05/28/2014 PCP: Redge Gainer, MD  Summary: 46yow presented with h/o generalized weakness, cough; became so weak walking at home almost passed out. No syncope. Was able to lie down, called EMS, SBP low 90s. Admitted for CAP. Condition quickly worsened with profound hypoxic respiratory failure and the patient was transferred to the stepdown unit and placed on BiPAP. Antibiotics were broadened and patient was seen by pulmonology in consultation. In consultation with healthcare power of attorney she was made DO NOT RESUSCITATE/DO NOT INTUBATE. Despite BiPAP, broad-spectrum antibiotics and aggressive care her condition failed to improve and after further conference, the patient was made full comfort care. She remains profoundly hypoxic but pain has been controlled with morphine and anxiety has been controlled with Ativan. After further discussion with healthcare power of attorney, plan transfer to residential hospice would anticipate death within the next 2-3 days.  Assessment/Plan: 1. CAP with sepsis.  2. Profound acute hypoxic respiratory failure. Secondary to pneumonia, sepsis.  3. Acute encephalopathy.  4. Anemia of critical illness 5. Generalized weakness 6. Pulmonary fibrosis.  7. DM type 2   Transfer to hospice facility today  Murray Hodgkins, MD  [facility]  Pager [phone] If 7PM-7AM, please contact night-coverage at [email or web address], password [facility] 06/04/2014, 1:04 PM  LOS: 7 days   Consultants:  pulmonology  Procedures:    Antibiotics:  Azithromycin 11/10 >>11/13  Ceftriaxone 11/10 >>11/13  Vancomycin 11/11 >>11/13  HPI/Subjective: No issues overnight.  Objective: Filed Vitals:   06/03/14 2049 06/03/14 2300 06/04/14 0300 06/04/14 0452  BP: 128/46 137/72  138/49  Pulse: 71 82  50  Temp: 98.3 F (36.8 C)   98.8 F (37.1 C)  TempSrc: Axillary   Axillary  Resp: 16 16  14   Height:        Weight:      SpO2: 53% 55% 56% 53%    Intake/Output Summary (Last 24 hours) at 06/04/14 1304 Last data filed at 06/04/14 1000  Gross per 24 hour  Intake      0 ml  Output   1550 ml  Net  -1550 ml     Filed Weights   05/28/14 2355 05/31/14 0500 06/01/14 0500  Weight: 53.9 kg (118 lb 13.3 oz) 55.2 kg (121 lb 11.1 oz) 54.9 kg (121 lb 0.5 oz)    Exam:     Afebrile, vitals are stable with the exception of SPO2 53%.  Appears calm, comfortable. Not responsive.  Cardiovascular regular rate and rhythm. Respiratory diffuse coarse breath sounds.  Scheduled Meds: . antiseptic oral rinse  7 mL Mouth Rinse q12n4p  . chlorhexidine  15 mL Mouth Rinse BID   Continuous Infusions:   Principal Problem:   CAP (community acquired pneumonia) Active Problems:   Anxiety   Diabetes mellitus type 2, controlled, without complications   HTN (hypertension)   Pulmonary fibrosis   Tricuspid regurgitation   SIRS (systemic inflammatory response syndrome)   Weakness generalized   Hypoxia   Sepsis   Acute respiratory failure with hypoxia   Acute encephalopathy   Blood poisoning

## 2014-06-04 NOTE — Progress Notes (Signed)
UR chart review completed.  

## 2014-06-04 NOTE — Progress Notes (Signed)
Pt to transfer to [facility] today per Dr. Sarajane Jews. Pt stable for transfer. Report called to Demitria , nurse at hospice home. [place], [place] aware of transfer per CSW.  EMS transport to arrive around 1800 per CSW. Will continue to monitor patient.

## 2014-06-04 NOTE — Progress Notes (Signed)
Nutrition Brief Note  Chart reviewed. Pt now transitioned to comfort care.  No further nutrition interventions warranted at this time.  Please re-consult as needed.   Colman Cater MS,RD,CSG,LDN Office: [phone] Pager: [phone]

## 2014-06-04 NOTE — Discharge Summary (Signed)
Physician Discharge Summary  Nohealani Medinger HQI:[id number] DOB: [birth year] DOA: 05/28/2014  PCP: Redge Gainer, MD  Admit date: 05/28/2014 Discharge date: 06/04/2014  Discharge Diagnoses:  1. Community acquired pneumonia with sepsis 2. Profound acute hypoxic respiratory failure secondary to pneumonia, sepsis 3. Acute encephalopathy 4. Pulmonary fibrosis 5. Diabetes mellitus type 2  Discharge Condition: terminal, likely less than 48 hours Disposition: residential hospice  Diet recommendation: as desired  Filed Weights   05/28/14 2355 05/31/14 0500 06/01/14 0500  Weight: 53.9 kg (118 lb 13.3 oz) 55.2 kg (121 lb 11.1 oz) 54.9 kg (121 lb 0.5 oz)    History of present illness:  89yow presented with h/o generalized weakness, cough; became so weak walking at home almost passed out. No syncope. Was able to lie down, called EMS, SBP low 90s. Admitted for CAP.   Hospital Course:  Ms. Irene Limbo' condition quickly worsened with profound hypoxic respiratory failure. She was transferred to the stepdown unit and placed on BiPAP. Antibiotics were broadened and patient was seen by pulmonology in consultation. In consultation with healthcare power of attorney she was made DO NOT RESUSCITATE/DO NOT INTUBATE. Despite BiPAP, broad-spectrum antibiotics and aggressive care her condition failed to improve and she remained BiPAP dependent. After further conference, the patient was made full comfort care. She remains profoundly hypoxic but pain has been controlled with morphine and anxiety has been controlled with Ativan. After further discussion with healthcare power of attorney, plan transfer to residential hospice would anticipate death within the next 2-3 days.  Care again discussed in detail with healthcare power of attorney on the day of discharge.  Consultants:  pulmonology  Procedures:    Antibiotics:  Azithromycin 11/10 >>11/13  Ceftriaxone 11/10 >>11/13  Vancomycin 11/11  >>11/13  Discharge Instructions   Current Discharge Medication List    START taking these medications   Details  LORazepam (ATIVAN) 0.5 MG tablet Place 1 tablet (0.5 mg total) under the tongue every 4 (four) hours as needed for anxiety. Qty: 30 tablet, Refills: 0    Morphine Sulfate (MORPHINE CONCENTRATE) 10 MG/0.5ML SOLN concentrated solution Place 0.25 mLs (5 mg total) under the tongue every hour as needed for severe pain. Qty: 30 mL, Refills: 0      STOP taking these medications     allopurinol (ZYLOPRIM) 100 MG tablet      ALPRAZolam (XANAX) 0.5 MG tablet      cholecalciferol (VITAMIN D) 1000 UNITS tablet      citalopram (CELEXA) 20 MG tablet      felodipine (PLENDIL) 10 MG 24 hr tablet      lisinopril-hydrochlorothiazide (PRINZIDE,ZESTORETIC) 20-25 MG per tablet      metFORMIN (GLUCOPHAGE) 500 MG tablet      methylcellulose (ARTIFICIAL TEARS) 1 % ophthalmic solution      Multiple Vitamin (MULTIVITAMIN) tablet      rosuvastatin (CRESTOR) 10 MG tablet      ALPRAZolam (XANAX) 0.5 MG tablet      glucose blood (TRUETRACK TEST) test strip      metFORMIN (GLUCOPHAGE) 500 MG tablet        Allergies  Allergen Reactions  . Ace Inhibitors Cough  . Bactrim [Sulfamethoxazole-Trimethoprim] Nausea Only  . Lipitor [Atorvastatin]     myalgia    The results of significant diagnostics from this hospitalization (including imaging, microbiology, ancillary and laboratory) are listed below for reference.    Significant Diagnostic Studies: Dg Chest 2 View  05/28/2014   CLINICAL DATA:  Syncope with fall  EXAM: CHEST  2 VIEW  COMPARISON:  February 17, 2011  FINDINGS: There is underlying emphysematous change. There is extensive consolidation in the region of the axillary subsegment of the right upper lobe. There is also patchy infiltrate in the right lower lobe. Interstitial fibrosis in the right base is also present. Left lung is clear except for what appears to be some fibrotic type  change in the left base. Heart size and pulmonary vascularity are within normal limits. No adenopathy. No pneumothorax. There is degenerative change in the thoracic spine.  IMPRESSION: Infiltrate axillary subsegment right upper lobe and in portions of the right lower lobe. There is underlying emphysema with fibrosis in the bases. Elsewhere lungs are clear. Cardiac silhouette unremarkable given underlying emphysema.  The infiltrate in the axillary subsegment of the right upper lobe has a somewhat nodular appearance. Given this circumstance, follow-up studies to clearing advised. If this area continues to have a nodular appearance after approximately 10-14 days, correlation with noncontrast enhanced chest CT to further evaluate at that time would be reasonable.   Electronically Signed   By: Lowella Grip M.D.   On: 05/28/2014 20:43   Dg Chest Port 1 View  05/31/2014   CLINICAL DATA:  Respiratory failure  EXAM: PORTABLE CHEST - 1 VIEW  COMPARISON:  Portable chest x-ray of May 30, 2014 and May 28, 2014  FINDINGS: The lungs are well-expanded. Confluent alveolar infiltrates are present bilaterally and are little changed since yesterday's study but are considerably more pronounced than on the earlier exam. The cardiac silhouette is normal in size. The pulmonary vascularity is indistinct but not clearly engorged. There is no pleural effusion. The trachea is midline. The bony thorax exhibits no acute abnormality.  IMPRESSION: Confluent alveolar opacities are consistent with bilateral pneumonia or other alveolar filling processes including noncardiac pulmonary edema. There has been little change since yesterday's study.   Electronically Signed   By: David  Martinique   On: 05/31/2014 07:36   Dg Chest Port 1 View  05/30/2014   CLINICAL DATA:  Hypoxia. Shortness of breath. Pulmonary fibrosis. Diabetes.  EXAM: PORTABLE CHEST - 1 VIEW  COMPARISON:  05/28/2014  FINDINGS: Severe progression of bilateral airspace  opacities noted, with extensive new left perihilar and basilar airspace opacities, increased in the right upper lobe opacity, and increase in the right perihilar and basilar airspace opacity. Heart size remains within normal limits.  Atherosclerotic aortic arch.  IMPRESSION: 1. Worsened considerable bilateral airspace opacities suggesting acute bilateral pneumonia, noncardiogenic edema, or pulmonary hemorrhage.   Electronically Signed   By: Sherryl Barters M.D.   On: 05/30/2014 14:29    Microbiology: Recent Results (from the past 240 hour(s))  Blood Culture (routine x 2)     Status: None   Collection Time: 05/28/14 11:05 PM  Result Value Ref Range Status   Specimen Description BLOOD LEFT ARM  Final   Special Requests BOTTLES DRAWN AEROBIC AND ANAEROBIC [place]  Final   Culture NO GROWTH 5 DAYS  Final   Report Status 06/02/2014 FINAL  Final  Blood Culture (routine x 2)     Status: None   Collection Time: 05/28/14 11:13 PM  Result Value Ref Range Status   Specimen Description BLOOD RIGHT HAND  Final   Special Requests   Final    BOTTLES DRAWN AEROBIC AND ANAEROBIC AEB=10CC ANA=7CC   Culture NO GROWTH 5 DAYS  Final   Report Status 06/02/2014 FINAL  Final  Culture, blood (routine x 2) Call MD if unable to obtain  prior to antibiotics being given     Status: None   Collection Time: 05/29/14  8:03 AM  Result Value Ref Range Status   Specimen Description BLOOD RIGHT ARM  Final   Special Requests BOTTLES DRAWN AEROBIC AND ANAEROBIC 6CC  Final   Culture NO GROWTH 5 DAYS  Final   Report Status 06/03/2014 FINAL  Final  Culture, blood (routine x 2) Call MD if unable to obtain prior to antibiotics being given     Status: None   Collection Time: 05/29/14  8:03 AM  Result Value Ref Range Status   Specimen Description BLOOD LEFT ARM  Final   Special Requests BOTTLES DRAWN AEROBIC AND ANAEROBIC 6CC  Final   Culture NO GROWTH 5 DAYS  Final   Report Status 06/03/2014 FINAL  Final  MRSA PCR Screening      Status: None   Collection Time: 05/30/14  3:44 PM  Result Value Ref Range Status   MRSA by PCR NEGATIVE NEGATIVE Final    Comment:        The GeneXpert MRSA Assay (FDA approved for NASAL specimens only), is one component of a comprehensive MRSA colonization surveillance program. It is not intended to diagnose MRSA infection nor to guide or monitor treatment for MRSA infections.      Labs: Basic Metabolic Panel:  Recent Labs Lab 05/28/14 1910 05/29/14 0805 05/31/14 0553 06/01/14 0713  NA 135* 136* 139 140  K 3.7 3.6* 3.4* 3.4*  CL 93* 97 99 103  CO2 23 24 24 23   GLUCOSE 225* 115* 131* 129*  BUN 27* 26* 35* 33*  CREATININE 1.09 0.86 1.03 0.77  CALCIUM 8.9 8.7 9.0 8.8   CBC:  Recent Labs Lab 05/28/14 1910 05/29/14 0805 05/31/14 0553 06/01/14 0713  WBC 13.5* 12.5* 15.0* 13.3*  NEUTROABS 11.8* 10.6*  --   --   HGB 10.7* 10.3* 9.4* 9.8*  HCT 31.3* 29.9* 26.8* 27.9*  MCV 91.8 90.6 88.7 88.6  PLT 165 189 277 308   Cardiac Enzymes:  Recent Labs Lab 05/28/14 1910  CKTOTAL 285*   CBG:  Recent Labs Lab 05/31/14 1206 05/31/14 1845 06/01/14 0115 06/01/14 0642 06/01/14 1139  GLUCAP 133* 119* 159* 112* 143*    Principal Problem:   CAP (community acquired pneumonia) Active Problems:   Anxiety   Diabetes mellitus type 2, controlled, without complications   HTN (hypertension)   Pulmonary fibrosis   Tricuspid regurgitation   SIRS (systemic inflammatory response syndrome)   Weakness generalized   Hypoxia   Sepsis   Acute respiratory failure with hypoxia   Acute encephalopathy   Blood poisoning   Time coordinating discharge: 20 minutes  Signed:  Murray Hodgkins, MD [facility] 06/04/2014, 1:10 PM

## 2014-06-19 DEATH — deceased

## 2014-06-28 ENCOUNTER — Ambulatory Visit: Payer: Medicare Other | Admitting: Nurse Practitioner

## 2014-06-29 ENCOUNTER — Ambulatory Visit: Payer: Medicare Other | Admitting: Nurse Practitioner

## 2014-07-11 ENCOUNTER — Other Ambulatory Visit: Payer: Medicare Other

## 2014-07-11 ENCOUNTER — Ambulatory Visit: Payer: Medicare Other

## 2015-05-15 IMAGING — CR DG CHEST 1V PORT
1 series · 1 of 1 positions shown · non-contrast
Comparison: 05/28/2014

CLINICAL DATA: Hypoxia. Shortness of breath. Pulmonary fibrosis.
Diabetes.

EXAM:
PORTABLE CHEST - 1 VIEW

[portable]
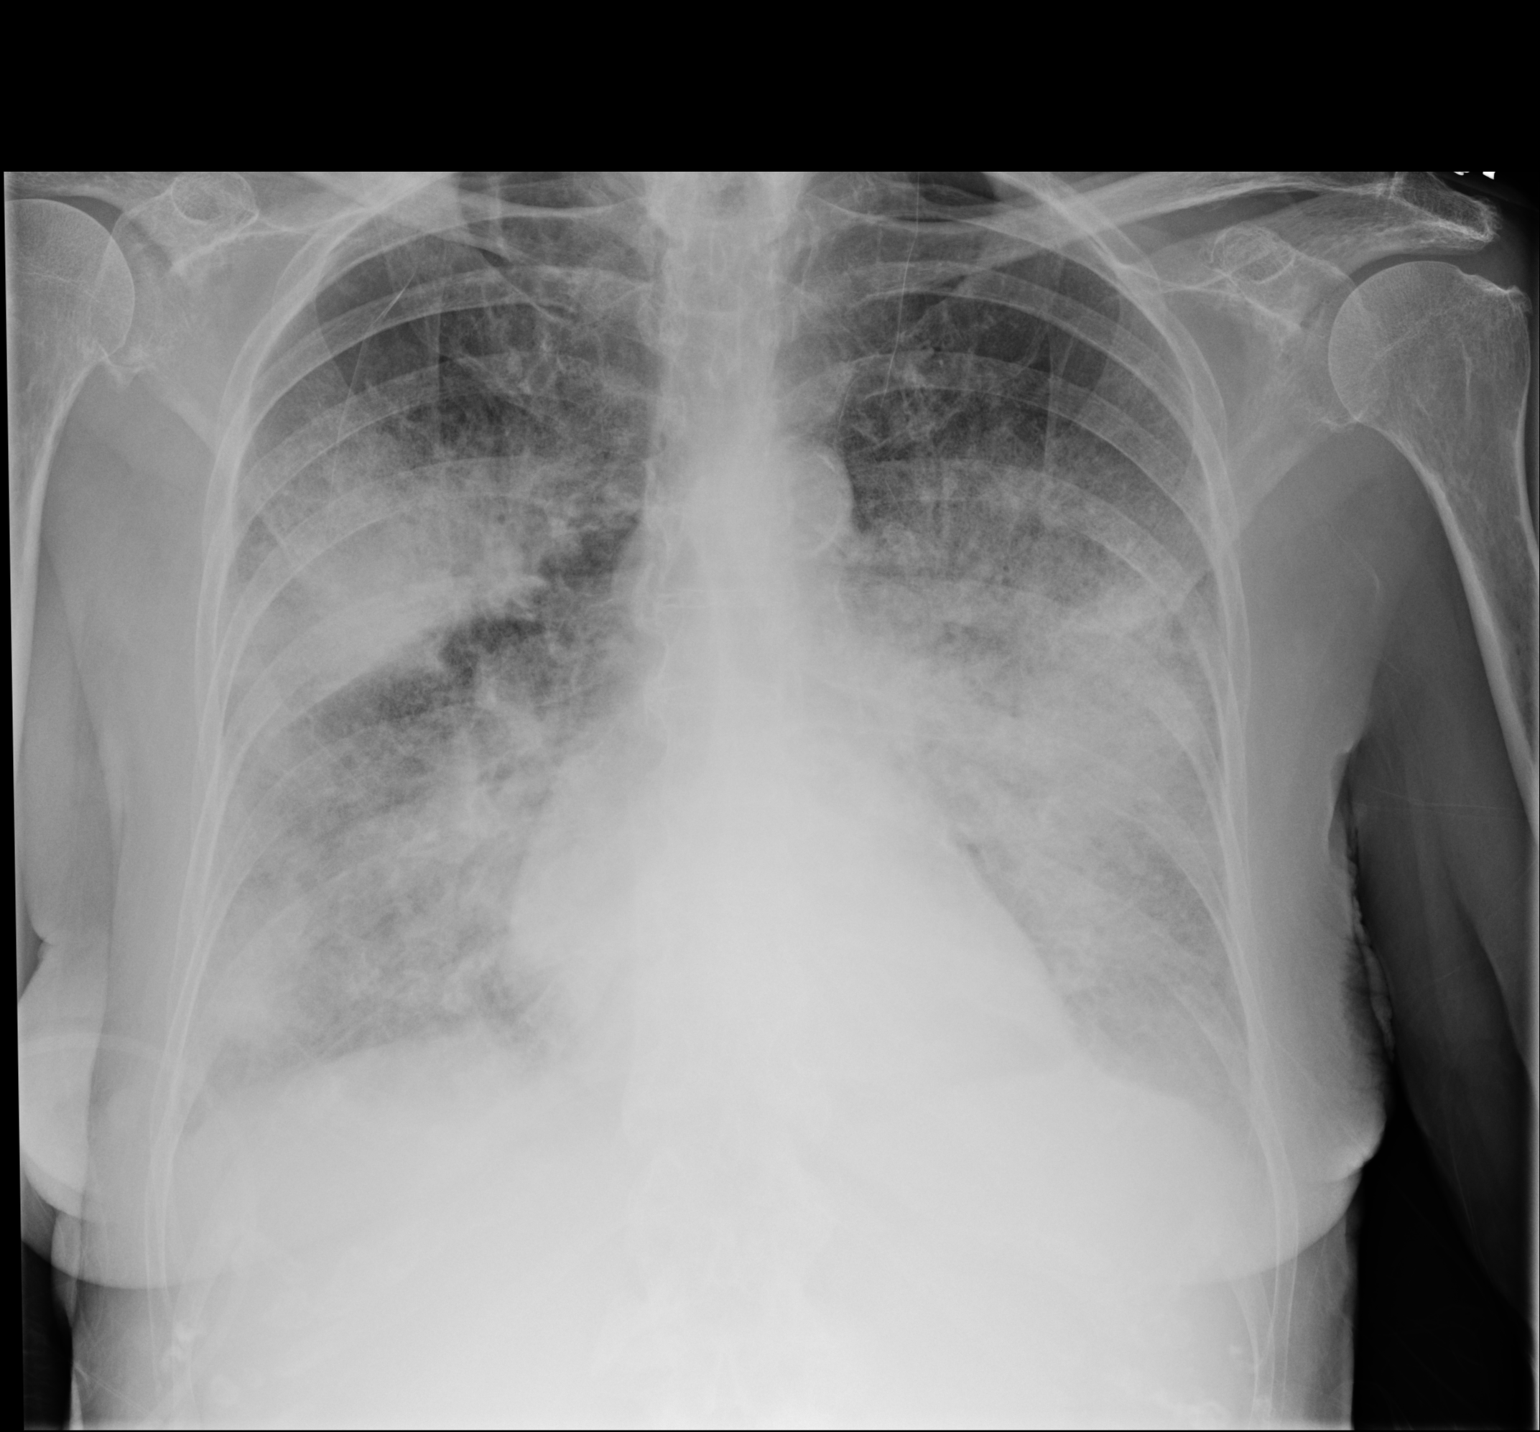

[1 of 1 positions shown; findings below may reference images not displayed]

FINDINGS: Severe progression of bilateral airspace opacities noted, with
extensive new left perihilar and basilar airspace opacities,
increased in the right upper lobe opacity, and increase in the right
perihilar and basilar airspace opacity. Heart size remains within
normal limits.

Atherosclerotic aortic arch.
IMPRESSION: 1. Worsened considerable bilateral airspace opacities suggesting
acute bilateral pneumonia, noncardiogenic edema, or pulmonary
hemorrhage.

## 2015-05-16 IMAGING — CR DG CHEST 1V PORT
1 series · 1 of 1 positions shown · non-contrast
Comparison: Portable chest x-ray May 30, 2014 and May 28, 2014

CLINICAL DATA: Respiratory failure

EXAM:
PORTABLE CHEST - 1 VIEW

[portable]
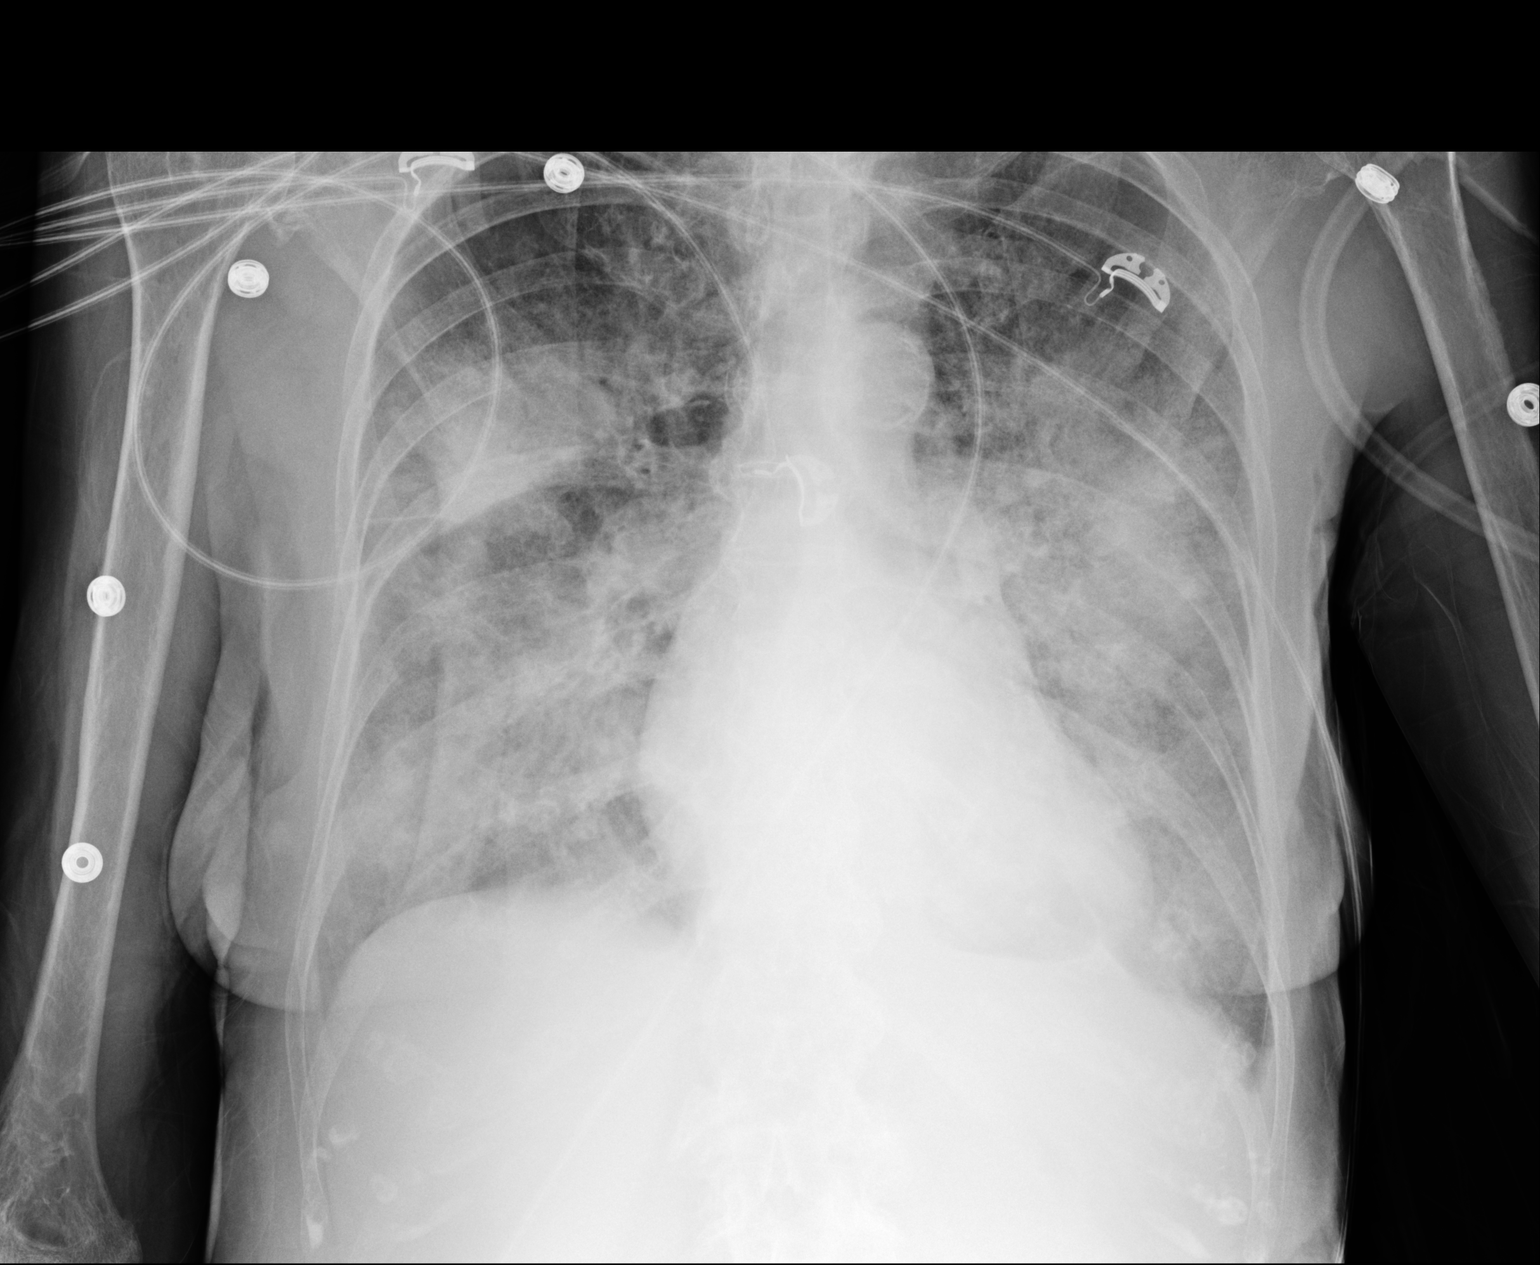

[1 of 1 positions shown; findings below may reference images not displayed]

FINDINGS: The lungs are well-expanded. Confluent alveolar infiltrates are
present bilaterally and are little changed since yesterday's study
but are considerably more pronounced than on the earlier exam. The
cardiac silhouette is normal in size. The pulmonary vascularity is
indistinct but not clearly engorged. There is no pleural effusion.
The trachea is midline. The bony thorax exhibits no acute
abnormality.
IMPRESSION: Confluent alveolar opacities are consistent with bilateral pneumonia
or other alveolar filling processes including noncardiac pulmonary
edema. There has been little change since yesterday's study.

## 2015-06-04 ENCOUNTER — Telehealth: Payer: Self-pay | Admitting: Nurse Practitioner

## 2015-08-02 ENCOUNTER — Telehealth: Payer: Self-pay | Admitting: Nurse Practitioner
